# Patient Record
Sex: Female | Born: 1985 | Race: White | Hispanic: No | Marital: Single | Smoking: Never smoker
Health system: Southern US, Community
[De-identification: ages and names within clinical notes are randomized; demographics above are authoritative.]

## PROBLEM LIST (undated history)

## (undated) DIAGNOSIS — F329 Major depressive disorder, single episode, unspecified: Secondary | ICD-10-CM

## (undated) DIAGNOSIS — Z8669 Personal history of other diseases of the nervous system and sense organs: Secondary | ICD-10-CM

## (undated) DIAGNOSIS — F32A Depression, unspecified: Secondary | ICD-10-CM

## (undated) DIAGNOSIS — E669 Obesity, unspecified: Secondary | ICD-10-CM

## (undated) DIAGNOSIS — N926 Irregular menstruation, unspecified: Secondary | ICD-10-CM

## (undated) DIAGNOSIS — E781 Pure hyperglyceridemia: Secondary | ICD-10-CM

## (undated) DIAGNOSIS — T7840XA Allergy, unspecified, initial encounter: Secondary | ICD-10-CM

## (undated) DIAGNOSIS — Z973 Presence of spectacles and contact lenses: Secondary | ICD-10-CM

## (undated) DIAGNOSIS — E282 Polycystic ovarian syndrome: Secondary | ICD-10-CM

## (undated) HISTORY — DX: Depression, unspecified: F32.A

## (undated) HISTORY — DX: Polycystic ovarian syndrome: E28.2

## (undated) HISTORY — DX: Presence of spectacles and contact lenses: Z97.3

## (undated) HISTORY — DX: Allergy, unspecified, initial encounter: T78.40XA

## (undated) HISTORY — DX: Major depressive disorder, single episode, unspecified: F32.9

## (undated) HISTORY — DX: Obesity, unspecified: E66.9

## (undated) HISTORY — DX: Pure hyperglyceridemia: E78.1

## (undated) HISTORY — PX: WISDOM TOOTH EXTRACTION: SHX21

## (undated) HISTORY — DX: Personal history of other diseases of the nervous system and sense organs: Z86.69

## (undated) HISTORY — DX: Irregular menstruation, unspecified: N92.6

---
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
## 2005-12-14 ENCOUNTER — Emergency Department (HOSPITAL_COMMUNITY): Admission: EM | Admit: 2005-12-14 | Discharge: 2005-12-14 | Payer: Self-pay | Admitting: Emergency Medicine

## 2006-02-23 ENCOUNTER — Emergency Department (HOSPITAL_COMMUNITY): Admission: EM | Admit: 2006-02-23 | Discharge: 2006-02-23 | Payer: Self-pay | Admitting: Emergency Medicine

## 2007-07-12 IMAGING — CR DG CHEST 2V
2 series · 2 of 2 positions shown · non-contrast
Comparison: None.

CLINICAL DATA: Fever.

[w chest pa]
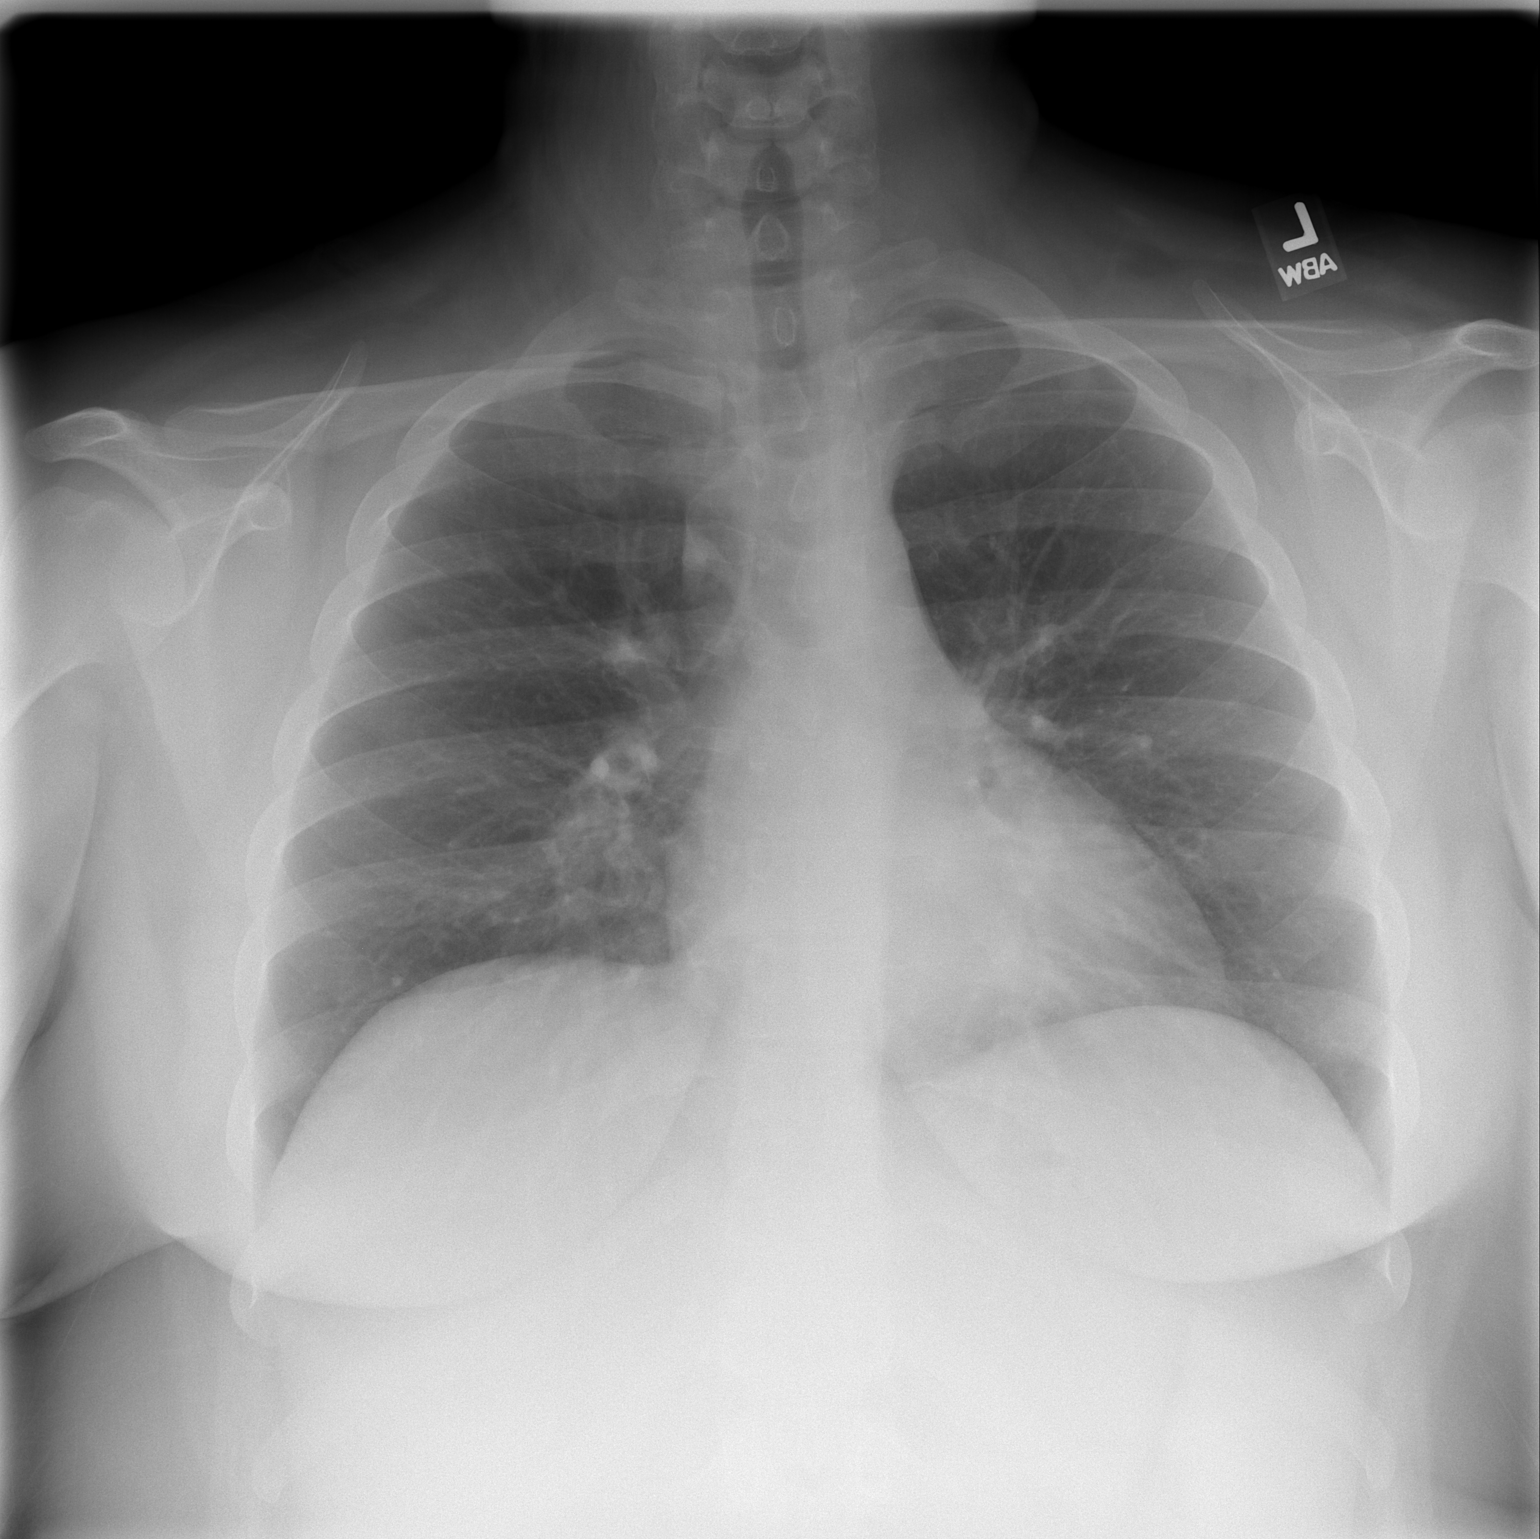

[w chest lat]
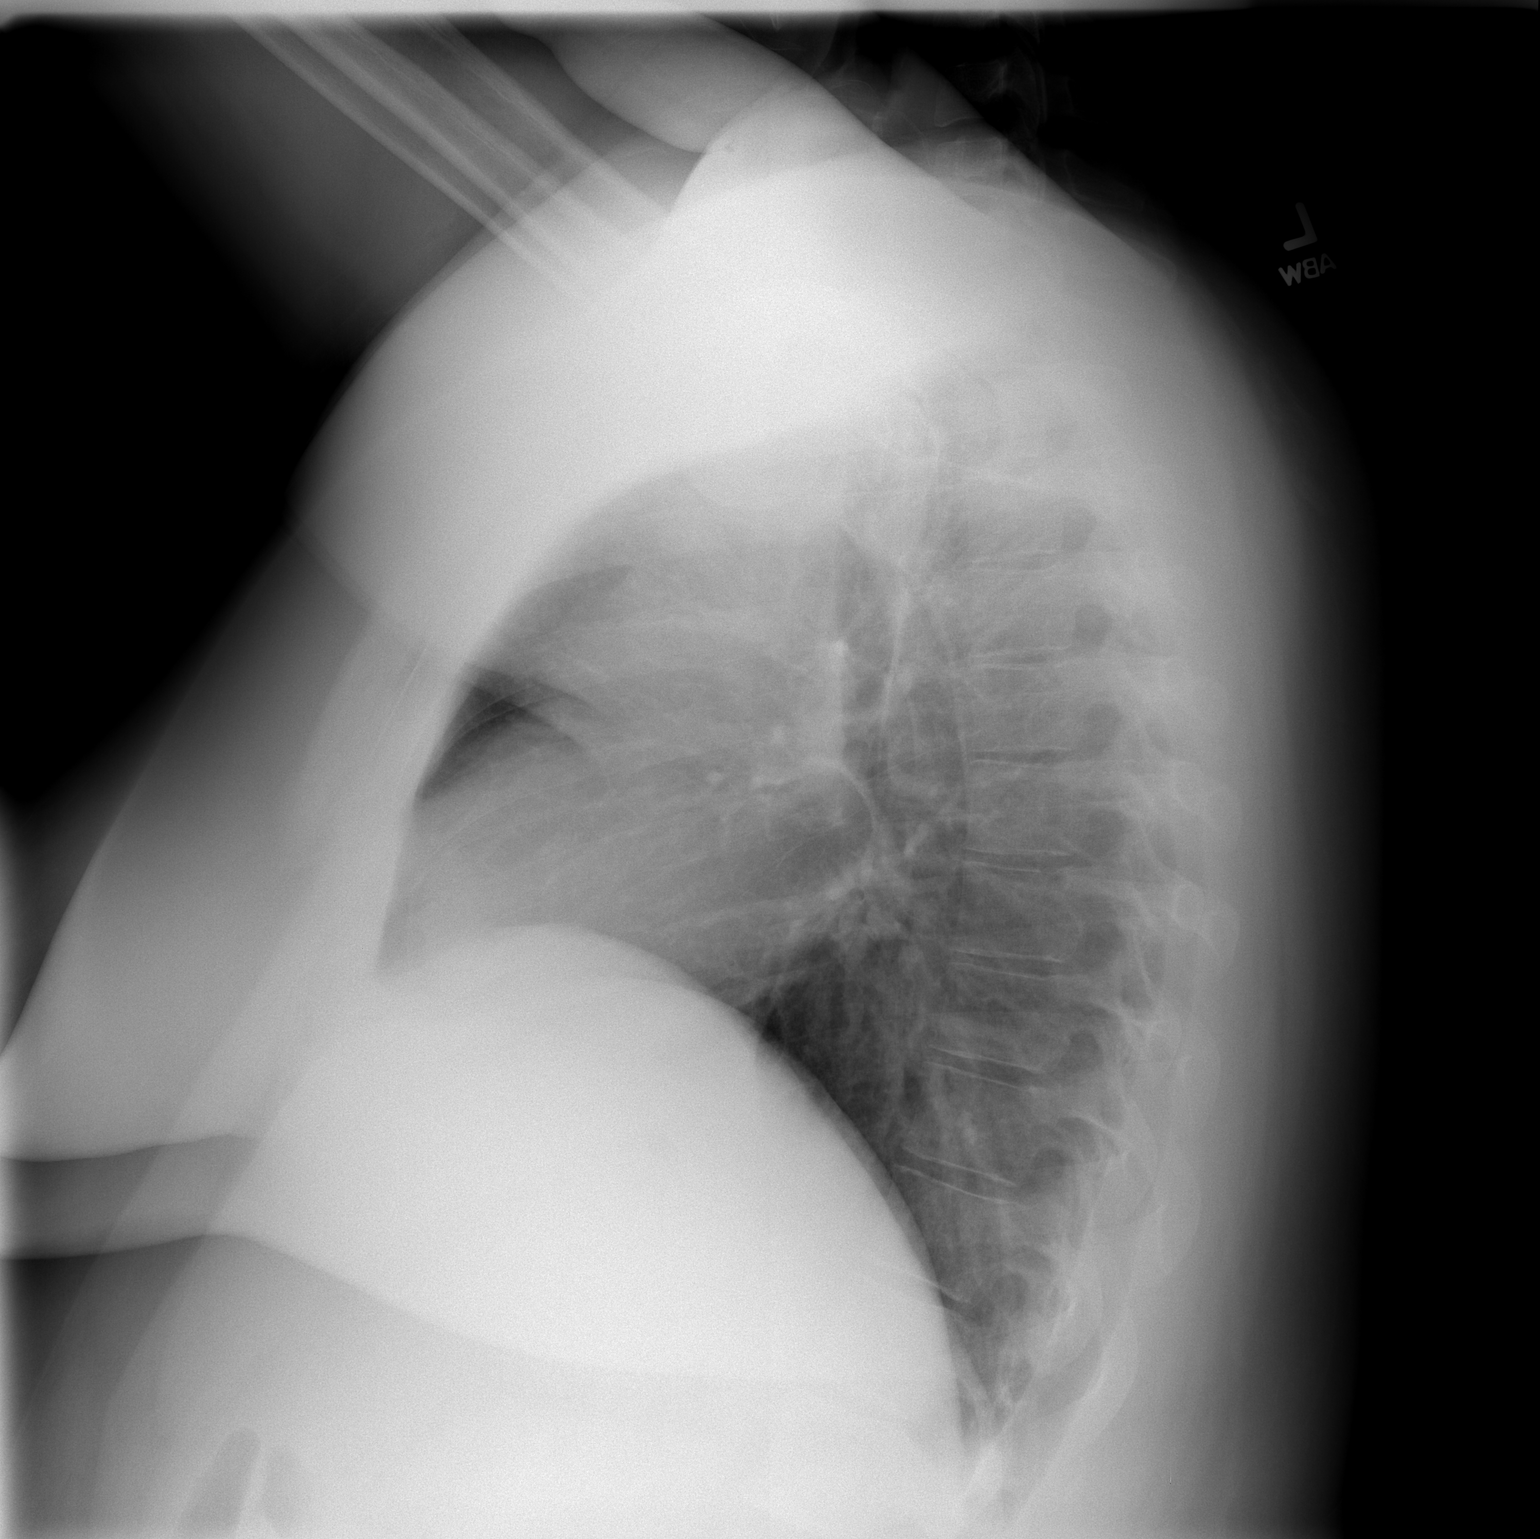

[2 of 2 positions shown; findings below may reference images not displayed]

CHEST - 2 VIEW:

The lungs are clear.  The cardiopericardial silhouette is within normal limits
for size.  Visualized bony structures of the thorax are intact.
IMPRESSION: No acute cardiopulmonary process

## 2010-01-08 ENCOUNTER — Ambulatory Visit: Payer: Self-pay | Admitting: Family Medicine

## 2010-01-08 DIAGNOSIS — M629 Disorder of muscle, unspecified: Secondary | ICD-10-CM | POA: Insufficient documentation

## 2010-01-08 DIAGNOSIS — F341 Dysthymic disorder: Secondary | ICD-10-CM | POA: Insufficient documentation

## 2010-01-08 DIAGNOSIS — N938 Other specified abnormal uterine and vaginal bleeding: Secondary | ICD-10-CM | POA: Insufficient documentation

## 2010-01-08 DIAGNOSIS — N949 Unspecified condition associated with female genital organs and menstrual cycle: Secondary | ICD-10-CM

## 2010-01-12 ENCOUNTER — Ambulatory Visit: Payer: Self-pay | Admitting: Family Medicine

## 2010-01-12 ENCOUNTER — Encounter: Payer: Self-pay | Admitting: Family Medicine

## 2010-01-12 LAB — CONVERTED CEMR LAB
Cholesterol: 186 mg/dL (ref 0–200)
FSH: 5.1 milliintl units/mL
HCT: 43.8 % (ref 36.0–46.0)
HDL: 41 mg/dL (ref >39)
MCV: 95.4 fL (ref 78.0–100.0)
RBC: 4.59 M/uL (ref 3.87–5.11)
TSH: 1.234 microintl units/mL (ref 0.350–4.500)
Total CHOL/HDL Ratio: 4.5
VLDL: 59 mg/dL — ABNORMAL HIGH (ref 0–40)
WBC: 7.9 10*3/uL (ref 4.0–10.5)

## 2010-01-15 ENCOUNTER — Encounter: Payer: Self-pay | Admitting: Family Medicine

## 2010-01-15 LAB — CONVERTED CEMR LAB
BUN: 9 mg/dL (ref 6–23)
CO2: 22 meq/L (ref 19–32)
Glucose, Bld: 82 mg/dL (ref 70–99)
Potassium: 4.3 meq/L (ref 3.5–5.3)

## 2010-01-16 ENCOUNTER — Encounter: Payer: Self-pay | Admitting: Family Medicine

## 2010-01-18 ENCOUNTER — Ambulatory Visit: Payer: Self-pay | Admitting: Sports Medicine

## 2010-01-18 DIAGNOSIS — M65839 Other synovitis and tenosynovitis, unspecified forearm: Secondary | ICD-10-CM | POA: Insufficient documentation

## 2010-01-18 DIAGNOSIS — M65849 Other synovitis and tenosynovitis, unspecified hand: Secondary | ICD-10-CM

## 2010-03-09 ENCOUNTER — Ambulatory Visit: Payer: Self-pay | Admitting: Family Medicine

## 2010-03-09 ENCOUNTER — Telehealth: Payer: Self-pay | Admitting: Family Medicine

## 2010-03-09 DIAGNOSIS — J029 Acute pharyngitis, unspecified: Secondary | ICD-10-CM | POA: Insufficient documentation

## 2010-03-09 LAB — CONVERTED CEMR LAB: Rapid Strep: NEGATIVE

## 2010-06-28 NOTE — Assessment & Plan Note (Signed)
Summary: sore throat x 2 d/[place]/spiegel   Vital Signs:  Patient profile:   25 year old female Height:      64.75 inches Weight:      251.6 pounds BMI:     42.34 Temp:     98.8 degrees F oral Pulse rate:   71 / minute BP sitting:   115 / 78  (left arm) Cuff size:   regular  Vitals Entered By: Garen Grams LPN (March 09, 2010 4:17 PM) CC: sore throat x 2-3 days Is Patient Diabetic? No Pain Assessment Patient in pain? no        Primary Care Provider:  Hulen Luster  CC:  sore throat x 2-3 days.  History of Present Illness: 1. Sore throat: - Been there for 2-3 days - She did have viral URI symptoms about 2 weeks ago and still has a little bit of a cough from that - Otherwise no other symptoms  ROS: denies fevers, swollen lymph nodes, chest pain, shortness of breath, problems eating or drinking  PMHx: She does have a hx of strep throat x 3 about 3 years ago.  SocHx: no new sexual partners  Habits & Providers  Alcohol-Tobacco-Diet     Tobacco Status: current     Tobacco Counseling: to quit use of tobacco products     Cigarette Packs/Day: <0.25  Current Medications (verified): 1)  Ketoprofen Gel 20% .... Use Over Hands Qid  Allergies: No Known Drug Allergies  Social History: Smoking Status:  current Packs/Day:  <0.25  Physical Exam  General:  Vitals reviewed.  Afebrile.  comfortable appearing.  Normal voice.  no acute distress. Eyes:  Normal conjunctiva.  Wears glasses Ears:  R ear normal and L ear normal.   Nose:  no external deformity and no nasal discharge.   Mouth:  Tonsils enlarged bilaterally.  OP red.  No exudate. Neck:  supple, no LAD Lungs:  normal respiratory effort, normal breath sounds, no crackles, and no wheezes.   Heart:  normal rate, regular rhythm, and no murmur.   Abdomen:  soft and non-tender.   Skin:  no rashes and no suspicious lesions.     Impression & Recommendations:  Problem # 1:  SORE THROAT (ICD-462) Assessment New Likely viral  pharyngitis.  Continue supportive care.  Precautions given.  Recheck if not better in 1 week. Orders: Rapid Strep-[facility] (16109) [facility]- Est Level  3 (60454)  Complete Medication List: 1)  Ketoprofen Gel 20%  .... Use over hands qid  Patient Instructions: 1)  Your strep test was negative 2)  Some things that can help with the sore throat include: chloraseptic spray, salt water mouth wash, throat lozenges 3)  It should be better in 1 week 4)  If not better in 1 week then please return to clinic 5)  If the sore throat gets worst or if it is painful to eat / drink then please return to clinic or go to the ED   Orders Added: 1)  Rapid Strep-[facility] [[id number]] 2)  [facility]- Est Level  3 [09811]    Laboratory Results  Date/Time Received: March 09, 2010 4:22 PM  Date/Time Reported: March 09, 2010 4:30 PM   Other Tests  Rapid Strep: negative Comments: ...........test performed by...........Marland KitchenTerese Door, CMA

## 2010-06-28 NOTE — Progress Notes (Signed)
Summary: triage  Phone Note Call from Patient Call back at [phone]   Caller: Patient Summary of Call: has a really bad sore throat and wondering if she can be seen today. Initial call taken by: Clydell Hakim,  March 09, 2010 9:25 AM  Follow-up for Phone Call        sore throat x 2-3. she will start gargling with watm salt water. needs late day appt. will see Dr. Lelon Perla ar 4pm Follow-up by: Golden Circle RN,  March 09, 2010 9:48 AM

## 2010-06-28 NOTE — Assessment & Plan Note (Signed)
Summary: np,df   Vital Signs:  Patient profile:   25 year old female Height:      64.75 inches Weight:      252 pounds BMI:     42.41 BSA:     2.18 Temp:     98.9 degrees F Pulse rate:   103 / minute BP sitting:   118 / 79  Vitals Entered By: Jone Baseman CMA (January 08, 2010 2:46 PM) CC: New patient Is Patient Diabetic? No Pain Assessment Patient in pain? no        Primary Care Provider:  Hulen Luster  CC:  New patient.  History of Present Illness: Pt new to practice today.  Aunt is our pt, Annamaria Helling.    contraception-stopped OCPs in October.  thought her moods were too volatile.  in 6 yr relationship.  using condoms.  having some spotting and irregular menses.  used to be irregular before pills, but was more likely to have fewer periods.  took home u preg 1 week ago that was negative.  moods- feels that she crys occasionally and does not have a lot of motivation.  this is not new, but she thought this would stop when she finished the OCPs.  no SI/HI, does not want to s   ee a counselor   diet- fried foods exercise-walking 2 miles per week.  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    Physical Exam  General:  Well-developed,well-nourished,in no acute distress; alert,appropriate and cooperative throughout  examination obese Head:  Normocephalic and atraumatic without obvious abnormalities. No apparent alopecia or balding. Eyes:  No corneal or conjunctival inflammation noted. EOMI. Perrla. Funduscopic exam benign, without hemorrhages, exudates or papilledema. Vision grossly normal. Ears:  External ear exam shows no significant lesions or deformities.  Otoscopic examination reveals clear canals, tympanic membranes are intact bilaterally without bulging, retraction, inflammation or discharge. Hearing is grossly normal bilaterally. Nose:  External nasal examination shows no deformity or inflammation. Nasal mucosa are pink and moist without lesions or exudates. Mouth:  Oral mucosa and oropharynx without lesions or exudates.  Teeth in good repair. Neck:  No deformities, masses, or tenderness noted. Lungs:  Normal respiratory effort, chest expands symmetrically. Lungs are clear to auscultation, no crackles or wheezes. Heart:  Normal rate and regular rhythm. S1 and S2 normal without gallop, murmur, click, rub or other extra sounds. Abdomen:  Bowel sounds positive,abdomen soft and non-tender without masses, organomegaly or hernias noted. Genitalia:  deferred, pt on menses Msk:  bilateral hands with cysts in between the knuckles.  the largest is in left hand, soft, quarter sized.  another 1/2 cm sized same hand, and dime sized one on right hand.   Pulses:  R and L carotid,radial,femoral,dorsalis pedis and posterior tibial pulses are full and equal bilaterally Extremities:  No clubbing, cyanosis, edema, or deformity noted with normal  full range of motion of all joints.   Neurologic:  No cranial nerve deficits noted. Station and gait are normal.Sensory, motor and coordinative functions appear intact. Skin:  Intact without suspicious lesions or rashes Cervical Nodes:  No lymphadenopathy noted Psych:  Cognition and judgment appear intact. Alert and cooperative with normal attention span and concentration. No  apparent delusions, illusions, hallucinations   Impression & Recommendations:  Problem # 1:  HEALTH MAINTENANCE EXAM (ICD-V70.0) Assessment New new pt physical today.  deferred pelvic, pt will return when not on menses.  due for PAP. check Lipids and A1c given obesity and posibility of PCOS.  advised to improve diet and increase exercise.  Problem # 2:  OTHER DISORDER OF MUSCLE LIGAMENT AND FASCIA (ICD-728.89) Assessment: New precepted with Dr. Mauricio Po who advised a sports medicine evaluation.  U/S may help determine what this is.  Does not seem to hinder movt, is not painful, not red or infected.  Problem # 3:  DYSFUNCTIONAL UTERINE BLEEDING (ICD-626.8) Assessment: New consider diagnosis of PCOS given body habitus, irregular periods. will get labs to evaluate.  Future Orders: CBC-[facility] (16109) ... 01/04/2011 FSH-[facility] ([id number]) ... 01/03/2011 Prolactin-[facility] ([id number]) ... 01/03/2011 TSH-[facility] ([id number]) ... 01/09/2011 Testosterone-[facility] ([id number]) ... 01/03/2011  Problem # 4:  DYSTHYMIA, SITUATIONAL (ICD-300.4) Assessment: New PHQ9 done today.  9 points.  question 9 is not at all.  also this is only somewhat difficult.  no SI/HI.  will watch for now.    Other Orders: Future Orders: Lipid-[facility] ([id number]) ... 01/03/2011 A1C-[facility] ([id number]) ... 12/28/2010  Patient Instructions: 1)  make an appt with sports medicine to follow up for your hand 2)  when you have finished bleeding, call to make appt for your pap.  3)  Also, make a lab appt to come in fasting for some lab work.  We can check thyroid, cholesterol, sugar, and your hormone levels.

## 2010-06-28 NOTE — Assessment & Plan Note (Signed)
Summary: ultrasound of hand/kh   Vital Signs:  Patient profile:   25 year old female BP sitting:   129 / 81  Vitals Entered By: Lillia Pauls CMA (January 18, 2010 9:12 AM)  Primary Provider:  Hulen Luster   History of Present Illness: 25yo R-handed female to office with c/o b/l hand swelling. Recently evaluated at [facility] 01/08/10 & noted to have swollen areas or dorsal aspect of hands between knuckles. Left hand swollen on dorsal aspect between 2nd & 3rd finger and between 3rd & 4th finger x 67-months. Had pain in area when started, but now only having occasional pain.   Right hand starting to swell on dorsal aspect between 2nd & 3rd fingers over last month.  Only having occasional pain in this hands. Occasional numbness/tingling of hands, but in no specific distribution. No injury/trauma. Using tylenol as needed. Works at [employer] & does a lot of typing.  Also works as a Midwife. 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 MS:  See HPI. Derm:  Denies rash.  Physical Exam  General:  well-hydrated, appropriate dress, cooperative to examination, and overweight-appearing.  well-hydrated, appropriate dress, and cooperative to examination.   Msk:  HANDS: Inspection:  areas of edema on  the dorsal aspect of the hands bilaterally noted between second and third metacarpals & third and fourth metacarpals (L>R) Palpation: no tenderness to palpation over the metacarpals or phylanges.  No tenderness over soft tissues. ROM: normal wrist and MCP flexion and extension, finger abduction and adduction, and movement at PIP and DIP bilaterally. Strength: 5/5 wrist flexion and extension, thumb extension, flexion and adduction, PIP and DIP flexion, and independent finger extension, bilaterally Pulses:  normal cap refill, radial pulses +2/4 b/l Neurologic:  sensation intact to light touch.   Additional Exam:  MSK U/S - bilateral hands: Significant fluid noted around the extensor digitorum tendon sheaths of the second and third fingers, bilaterally (R>L).  Normal appearing tendon sheath for 4th & 5th fingers .Images saved.   Impression & Recommendations:  Problem # 1:  OTHER TENOSYNOVITIS OF HAND AND WRIST (ICD-727.05)  - Tenosynovitis of bilateral finger extensors in area between 2nd & 3rd metacarpals and between 3rd & 4th metacarpals - L>R - Rx for ketaprofen gel given to apply to area 3-4 times daily - Should lower keyboard at work to decrease stress on area - Buddy tape 2nd & 3rd fingers of both hands when able to limit stress on tendons. - f/u 6-weeks for re-evaluation & f/u ultrasound.  If symptoms persist could consider hand therapy or brace. - encouraged to call with any questions or concerns.  Orders: [place] LIMITED ([id number])  Complete Medication List: 1)  Ketoprofen Gel 20%  .... Use over hands qid  Patient Instructions: 1)  You have some swelling left in the space around the tendons in your fingers. 2)  Lower your keyboard at work so that you don't have to extend your wrist back as much. 3)  Tape your pointer and middle fingers together for the next couple weeks so that the muscles work together. 4)  Use the topical ketoprofen medication on the swollen areas and use ice to bring  down the swelling. 5)  Please come back in 6 weeks so we can look to see if the swelling has gone down. If it doesn't get better, we can think about hand therapy and a brace at that point. Prescriptions: KETOPROFEN GEL 20% Use over hands qid  #60 gms x 6   Entered by:   Lillia Pauls CMA   Authorized by:   Enid Baas MD   Signed by:   Lillia Pauls CMA on 01/18/2010   Method used:   Print then Give to Patient   RxID:   [id number]

## 2010-06-28 NOTE — Letter (Signed)
Summary: Generic Letter  [facility]  [address]   [place], [place] [address]   Phone: [phone]  Fax: [phone]    01/16/2010  Victoria Wilson [address] [place], [place]  [address]  Dear Ms. Gossman,  I wanted to let you know that overall your labs look good.  no evidence of diabetes at this time.  Your cholesterol was good except for triglycerides which were elevated.  You should try to limit fried foods and sweets as much as possible.       TLC Diet (Therapeutic Lifestyle Change): Saturated Fats & Transfatty acids should be kept < 7% of total calories ***Reduce Saturated Fats Polyunstaurated Fat can be up to 10% of total calories Monounsaturated Fat Fat can be up to 20% of total calories Total Fat should be no greater than 25-35% of total calories Carbohydrates should be 50-60% of total calories Protein should be approximately 15% of total calories Fiber should be at least 20-30 grams a day ***Increased fiber may help lower LDL Total Cholesterol should be < 200mg /day Consider adding plant stanol/sterols to diet (example: Benacol spread) ***A higher intake of unsaturated fat may reduce Triglycerides and Increase HDL    Adjunctive Measures (may lower LIPIDS and reduce risk of Heart Attack) include: Aerobic Exercise (20-30 minutes 3-4 times a week) Limit Alcohol Consumption Weight Reduction Aspirin 75-81 mg a day by mouth (if not allergic or contraindicated) Dietary Fiber 20-30 grams a day by mouth     Current Medications:  None If you have any questions, please call. We appreciate being able to work with you.   Sincerely,    [facility] Ellery Plunk MD   Appended Document: Generic Letter mailed

## 2010-10-16 ENCOUNTER — Emergency Department (HOSPITAL_COMMUNITY)
Admission: EM | Admit: 2010-10-16 | Discharge: 2010-10-16 | Discharge status: Against Medical Advice | Payer: Self-pay | Attending: Emergency Medicine | Admitting: Emergency Medicine

## 2010-10-16 DIAGNOSIS — W268XXA Contact with other sharp object(s), not elsewhere classified, initial encounter: Secondary | ICD-10-CM | POA: Insufficient documentation

## 2010-10-16 DIAGNOSIS — S61409A Unspecified open wound of unspecified hand, initial encounter: Secondary | ICD-10-CM | POA: Insufficient documentation

## 2010-12-19 ENCOUNTER — Ambulatory Visit (INDEPENDENT_AMBULATORY_CARE_PROVIDER_SITE_OTHER): Payer: BC Managed Care – PPO | Admitting: Medical

## 2010-12-19 ENCOUNTER — Encounter: Payer: Self-pay | Admitting: Medical

## 2010-12-19 VITALS — BP 120/84 | HR 68 | Temp 99.0°F | Ht 63.0 in | Wt 250.0 lb

## 2010-12-19 DIAGNOSIS — F329 Major depressive disorder, single episode, unspecified: Secondary | ICD-10-CM

## 2010-12-19 DIAGNOSIS — F32A Depression, unspecified: Secondary | ICD-10-CM

## 2010-12-19 DIAGNOSIS — J029 Acute pharyngitis, unspecified: Secondary | ICD-10-CM

## 2010-12-19 DIAGNOSIS — G479 Sleep disorder, unspecified: Secondary | ICD-10-CM

## 2010-12-19 DIAGNOSIS — E669 Obesity, unspecified: Secondary | ICD-10-CM

## 2010-12-19 MED ORDER — CITALOPRAM HYDROBROMIDE 20 MG PO TABS: ORAL_TABLET | ORAL | Status: DC

## 2010-12-19 NOTE — Progress Notes (Signed)
Subjective:     Victoria Wilson is a 25 y.o. female who presents as a new patient for concern of sore throat and tongue swelling.  She report 1 wk hx/o back of tongue feeling swollen and with bumps, tonsils are red and swollen.  Boyfriend was sick with a cold last week.  She notes similar symptoms back in March with normal throat culture.  Using Dayquil and Nyquil, also using salt water gargles.   No other aggravating or relieving factors.  She has not started any recent medications, no other swelling.  No rash.   She notes other ongoing issues in which she has not seen a doctor for.   She did not have insurance prior so she did not seek medical care for a while now.   She is past due on Pap smear.  She notes ongoing issues with decreased mood, stressors, worries a lot, has crying spells all time, sometimes about little things. She does note being depressed off and on for the past year.  Her depressed mood is worse around her periods and they're crampy and painful. She has trouble sleeping, and gets about one bad migraine per month.  He works as a TEFL teacher. She has never been on medication for depressed mood   The following portions of the patient's history were reviewed and updated as appropriate: allergies, current medications, past family history, past medical history, past social history, past surgical history and problem list.  Review of Systems Constitutional: denies fever, chills, sweats, unexpected weight change, anorexia, fatigue Allergy: denies recent sneezing, itching, congestion Dermatology: denies rash ENT: no runny nose, ear pain, sinus pain, teeth pain Cardiology: denies chest pain, palpitations Respiratory: +mild cough; denies shortness of breath, wheezing,  Gastroenterology: denies abdominal pain, nausea, vomiting, diarrhea Musculoskeletal: denies arthralgias, myalgias  Objective:     Filed Vitals:   12/19/10 1109  BP: 120/84  Pulse: 68  Temp: 99 F (37.2 C)     General appearance: no distress, WD/WN, pleasant, white female Skin: no rash, no warmth HEENT: normocephalic, conjunctiva/corneas normal, sclerae anicteric, nares patent, no discharge or erythema, pharynx with erythema, no exudate.  Oral cavity: MMM, no lesions  Neck: supple, no lymphadenopathy, no thyromegaly Heart: RRR, normal S1, S2, no murmurs Lungs: CTA bilaterally, no wheezes, rhonchi, or rales  Laboratory Strep test done. Results:negative.    Assessment:   Encounter Diagnoses  Name Primary?  . Pharyngitis Yes  . Depression   . Obesity   . Sleep disturbance       Plan:   Pharyngitis-Advised that symptoms and exam suggest a viral etiology.  Discussed symptomatic treatment including salt water gargles, warm fluids, rest, hydrate well, can use over-the-counter Tylenol or ibuprofen for throat pain, fever, or malaise. If worse or not improving within 2-3 days, call or return.   Depression-we discussed her concerns and stressors which include financial, work related stress, and relationship stress. I advise she began exercising more frequently. She agrees to a trial of citalopram. Recheck on this in 2-3 weeks. Discussed risk/benefits of medication.  Obesity-discussed the need to work on diet, exercise and weight loss changes.  Sleep disturbance-discussed sleep hygiene, recheck in 2-3 weeks.  Return 2-3 weeks for recheck on citalopram. She also needs to return for a complete physical.

## 2011-01-21 ENCOUNTER — Telehealth: Payer: Self-pay | Admitting: *Deleted

## 2011-01-21 NOTE — Telephone Encounter (Signed)
If she is taking 1 tablet QHS, then take 1/2 tablet QHS for 2-3 days, then stop this medication.  Lets have her come back in for followup and discussion of other options regarding her mood/symptoms.

## 2011-01-21 NOTE — Telephone Encounter (Signed)
Pt informed to take Celexa 1/2 tablet QHS for the next 2-3 days and then to stop taking celexa.  Pt scheduled to come in on Friday at 8:45am to discuss other medications for depression.  CM, LPN

## 2011-01-29 ENCOUNTER — Institutional Professional Consult (permissible substitution): Payer: BC Managed Care – PPO | Admitting: Medical

## 2011-02-15 ENCOUNTER — Ambulatory Visit (INDEPENDENT_AMBULATORY_CARE_PROVIDER_SITE_OTHER): Payer: BC Managed Care – PPO | Admitting: Medical

## 2011-02-15 ENCOUNTER — Encounter: Payer: Self-pay | Admitting: Medical

## 2011-02-15 VITALS — BP 116/72 | HR 100 | Temp 98.5°F | Resp 20 | Ht 63.5 in | Wt 254.0 lb

## 2011-02-15 DIAGNOSIS — R51 Headache: Secondary | ICD-10-CM

## 2011-02-15 DIAGNOSIS — F329 Major depressive disorder, single episode, unspecified: Secondary | ICD-10-CM

## 2011-02-15 DIAGNOSIS — F3289 Other specified depressive episodes: Secondary | ICD-10-CM

## 2011-02-15 DIAGNOSIS — R519 Headache, unspecified: Secondary | ICD-10-CM

## 2011-02-15 DIAGNOSIS — F32A Depression, unspecified: Secondary | ICD-10-CM

## 2011-02-15 MED ORDER — SERTRALINE HCL 25 MG PO TABS: 25.0000 mg | ORAL_TABLET | Freq: Every day | ORAL | Status: DC

## 2011-02-15 NOTE — Patient Instructions (Addendum)
Use ice pack or cool compress to the back of the left side of your head twice daily, and use OTC Aleve twice daily for the next 1-2 weeks.  If not resolving, improving, or if getting worse, call or return.   Things to watch for:  If red, hot, more tenderness, this may suggest infection.   If bony, hard growth getting bigger, then certainly return.  If you notice a mobile, round mass, this may suggest a cyst.   Thus, if you notice any of these changes, let me know.    Regarding your citalopram  - take 1/2 tablet daily until you run out.  Simultaneously, begin 1/2 tablet of the Zoloft daily.  Once you run out of Citalopram, then begin 1 tablet of Zoloft daily.

## 2011-02-15 NOTE — Progress Notes (Signed)
  Subjective:   HPI  Victoria Wilson is a 25 y.o. female who presents for recheck.  Here for c/o knot in the back of head.  First noticed it yesterday.  Not sure if it was there prior or not.  It hurts to press on it.  Denies heat, redness.  Does get ringing in ear at times, vision seems different at times.  At times looking down, vision seems off.  Last eye doctor visit 1 year ago.  Otherwise has been in her usual state of health.  No other aggravating or relieving factors.    She is here for recheck on depression.  I started her on citalopram at last visit, and she does notice improvement in her mood, less anxiety and agitation, certainly less crying spells.  However, since being on the medication, her sleep has been aggravated, she notices increased sweating, her appetite is off compared to normal, and she has been getting weird dreams. She would like to try a different medication instead.  The following portions of the patient's history were reviewed and updated as appropriate: allergies, current medications, past family history, past medical history, past social history, past surgical history and problem list.  Past Medical History  Diagnosis Date  . Allergy   . Obesity   . Hypertriglyceridemia   . Wears glasses   . Irregular periods   . History of migraine headaches     Review of Systems Constitutional: denies fever, chills, sweats, unexpected weight change, fatigue Dermatology: denies rash ENT: no runny nose, ear pain, sore throat, hoarseness, sinus pain Cardiology: denies chest pain, palpitations, edema Respiratory: denies cough, shortness of breath, wheezing Gastroenterology: denies abdominal pain, nausea, vomiting, diarrhea, constipation Musculoskeletal: denies arthralgias, myalgias, no neck pain Urology: denies dysuria Neurology: no weakness, tingling, numbness     Objective:   Physical Exam  General appearance: alert, no distress, WD/WN, white female HEENT:  Left  posterolateral occiput with slight hard raised area, tender, only slightly asymmetrical compared to right occiput.  No obvious warmth, erythema, fluctuance, no obvious bony mass, no obvious cyst.  Otherwise normocephalic, sclerae anicteric, PERRLA, EOMi, TMs pearly, nares patent, no discharge or erythema, pharynx normal Oral cavity: MMM, no lesions Neck: supple, no lymphadenopathy, no thyromegaly, no masses, nontender Heart: RRR, normal S1, S2, no murmurs Lungs: CTA bilaterally, no wheezes, rhonchi, or rales Neurological: alert, oriented x 3, CN2-12 intact   Assessment :    Encounter Diagnoses  Name Primary?  . Occipital pain Yes  . Depression       Plan:  Occipital pain - advised that the exam finding is subtle and hard to say at this point whether or not this represents a bony abnormality versus soft tissue abnormality.  We will use a watch and wait approach. For now she'll use ice or cool compress as well as ibuprofen assuming this may be inflammation or lymphadenitis.  We discussed signs of infection versus signs of bony tissue growth, and she will call or return if this is not resolving within the next 2-3 weeks. Or she can call otherwise when necessary if things change.  Depression - although she had improvement in terms of crying spells and depressed mood, she also has had increased and sleep problems, sweats, unusual dreams, and does not wish to continue on citalopram. We will wean her off citalopram, and we will start her on Zoloft as an alternative. She will recheck in one month, sooner if needed.  Handout with instructions given.

## 2011-02-26 ENCOUNTER — Encounter: Payer: Self-pay | Admitting: Medical

## 2011-02-26 ENCOUNTER — Ambulatory Visit (INDEPENDENT_AMBULATORY_CARE_PROVIDER_SITE_OTHER): Payer: BC Managed Care – PPO | Admitting: Medical

## 2011-02-26 VITALS — BP 128/70 | HR 68 | Temp 98.1°F | Resp 20 | Ht 65.0 in | Wt 257.0 lb

## 2011-02-26 DIAGNOSIS — L0212 Furuncle of neck: Secondary | ICD-10-CM

## 2011-02-26 MED ORDER — DOXYCYCLINE HYCLATE 100 MG PO TABS: 100.0000 mg | ORAL_TABLET | Freq: Two times a day (BID) | ORAL | Status: AC

## 2011-02-26 NOTE — Progress Notes (Signed)
Subjective:   HPI Victoria Wilson is a 25 y.o. female who presents for sore bump that is red and swollen on back of neck.  She saw me on 02/15/11 for swollen lymph nodes behind left ear, but over the last week or so she has developed a red bump that drained some pus and blood a few days ago.  The whole back of neck is tender and she notes some new painful lymph nodes swollen.  No other aggravating or relieving factors.  No other c/o.  The following portions of the patient's history were reviewed and updated as appropriate: allergies, current medications, past family history, past medical history, past social history, past surgical history and problem list.  Past Medical History  Diagnosis Date  . Allergy   . Obesity   . Hypertriglyceridemia   . Wears glasses   . Irregular periods   . History of migraine headaches    Review of Systems Gen: no fever, chills, sweats GI: no pain, nvd HEENT: no sore throat,t congestion, cough, ear pain Neck: posterior tenderness     Objective:   Physical Exam  General appearance: alert, no distress, WD/WN Skin: posterior neck with 2 erythematous tender papules, several small tender lymph nodes, but otherwise no drainage, no induration, no fluctuance Neck: nontender otherwise, no masses, no thyromegaly   Assessment :    Encounter Diagnosis  Name Primary?  Hester Mates of neck Yes     Plan:    Antibiotic: Doxycycline, advised warm compresses, rest, keep area clean with soap and water.  RTC if worse or not improving.

## 2011-02-26 NOTE — Patient Instructions (Signed)
Begin Doxycycline antibiotic, use warm compresses, and use OTC Ibuprofen for pain and inflammation.  If not improving or worse let me know.

## 2011-03-19 ENCOUNTER — Telehealth: Payer: Self-pay | Admitting: Medical

## 2011-03-20 ENCOUNTER — Institutional Professional Consult (permissible substitution): Payer: BC Managed Care – PPO | Admitting: Medical

## 2011-03-20 ENCOUNTER — Ambulatory Visit: Payer: BC Managed Care – PPO | Admitting: Medical

## 2011-03-20 NOTE — Telephone Encounter (Signed)
She is due back now for recheck on medication.

## 2011-03-21 NOTE — Telephone Encounter (Signed)
Pt was called and an appointment was made 

## 2011-04-02 ENCOUNTER — Institutional Professional Consult (permissible substitution): Payer: BC Managed Care – PPO | Admitting: Medical

## 2011-04-04 ENCOUNTER — Ambulatory Visit (INDEPENDENT_AMBULATORY_CARE_PROVIDER_SITE_OTHER): Payer: BC Managed Care – PPO | Admitting: Medical

## 2011-04-04 ENCOUNTER — Encounter: Payer: Self-pay | Admitting: Medical

## 2011-04-04 VITALS — BP 122/70 | HR 120 | Temp 98.4°F | Wt 260.0 lb

## 2011-04-04 DIAGNOSIS — F329 Major depressive disorder, single episode, unspecified: Secondary | ICD-10-CM

## 2011-04-04 DIAGNOSIS — F32A Depression, unspecified: Secondary | ICD-10-CM | POA: Insufficient documentation

## 2011-04-04 DIAGNOSIS — R000 Tachycardia, unspecified: Secondary | ICD-10-CM | POA: Insufficient documentation

## 2011-04-04 DIAGNOSIS — E669 Obesity, unspecified: Secondary | ICD-10-CM | POA: Insufficient documentation

## 2011-04-04 MED ORDER — SERTRALINE HCL 50 MG PO TABS: 50.0000 mg | ORAL_TABLET | Freq: Every day | ORAL | Status: DC

## 2011-04-04 NOTE — Progress Notes (Signed)
Subjective:   HPI  Victoria Wilson is a 25 y.o. female who presents for recheck on depression and medication.  Overall she feels as though Zoloft 25 mg has made an improvement in her mood and certainly improved her crying spells.  She had bad nightmares with Citalopram, but has tolerated Zoloft very well. She thinks it could be a little stronger though.  She has had some new stressors recently including she moved to a different house, took in a new pet that is 9yo but not completely house broken, and still has stress with work.  She works as a TEFL teacher.  She is in a relationship and this is going well.  She notes that several people on her father's side have depression and her father drinks a lot of alcohol.  No other aggravating or relieving factors.    No other c/o.  The following portions of the patient's history were reviewed and updated as appropriate: allergies, current medications, past family history, past medical history, past social history, past surgical history and problem list.  Past Medical History  Diagnosis Date  . Allergy   . Obesity   . Hypertriglyceridemia   . Wears glasses   . Irregular periods   . History of migraine headaches   . Depression    Review of Systems Constitutional: -fever, -chills, -sweats, -unexpected -weight change,-fatigue ENT: -runny nose, -ear pain, -sore throat Cardiology:  -chest pain, -palpitations, -edema Respiratory: -cough, -shortness of breath, -wheezing Gastroenterology: -abdominal pain, -nausea, -vomiting, -diarrhea, -constipation Hematology: -bleeding or bruising problems Musculoskeletal: -arthralgias, -myalgias, -joint swelling, -back pain Ophthalmology: -vision changes Urology: -dysuria, -difficulty urinating, -hematuria, -urinary frequency, -urgency Neurology: -headache, -weakness, -tingling, -numbness    Objective:   Physical Exam  Filed Vitals:   04/04/11 1515  BP: 122/70  Pulse: 120  Temp: 98.4 F (36.9 C)     General appearance: alert, no distress, WD/WN, white female  Neck: supple, no lymphadenopathy, no thyromegaly, no masses Heart: RRR, normal S1, S2, no murmurs Lungs: CTA bilaterally, no wheezes, rhonchi, or rales Pulses: 2+ symmetric, upper and lower extremities, normal cap refill   Assessment and Plan :    Encounter Diagnoses  Name Primary?  . Depression Yes  . Obesity   . Tachycardia    Depression - we will increase Zoloft to 50mg  QHS.  She will call in 2 wk to let me know how this is doing, sooner prn.  Obesity - discussed diet, exercise, and weight loss strategies and consequences of ongoing obesity  Tachycardia - likely related to her weight.  Advised lifestyle changes.  Follow-up 3-4 mo for complete physical

## 2011-04-30 ENCOUNTER — Other Ambulatory Visit: Payer: Self-pay | Admitting: Medical

## 2011-04-30 ENCOUNTER — Telehealth: Payer: Self-pay | Admitting: Medical

## 2011-04-30 MED ORDER — SERTRALINE HCL 50 MG PO TABS: 50.0000 mg | ORAL_TABLET | Freq: Every day | ORAL | Status: DC

## 2011-04-30 NOTE — Telephone Encounter (Signed)
rx sent for 90 day 

## 2011-05-01 NOTE — Telephone Encounter (Signed)
CALLED PT. ADVISED THAT 90 DY SCRIPT CALLED IN.

## 2012-05-14 ENCOUNTER — Other Ambulatory Visit: Payer: Self-pay | Admitting: Medical

## 2012-05-14 NOTE — Telephone Encounter (Signed)
Pt called and stated that she needed a refill on zoloft. When I looked at history I asked if she was taking regularly. She stated that she was trying not to take it anymore but she needed to be back on the medication. [facility] uses [facility].

## 2012-05-15 ENCOUNTER — Other Ambulatory Visit: Payer: Self-pay | Admitting: Medical

## 2012-05-15 MED ORDER — SERTRALINE HCL 50 MG PO TABS: 50.0000 mg | ORAL_TABLET | Freq: Every day | ORAL | Status: DC

## 2012-05-15 NOTE — Telephone Encounter (Signed)
L/m and informed pt rx is at pharmacy and she needs and appointment per shane

## 2012-05-15 NOTE — Telephone Encounter (Signed)
i sent a month supply, and lets see her back to discuss medications and concerns

## 2012-11-23 ENCOUNTER — Ambulatory Visit (INDEPENDENT_AMBULATORY_CARE_PROVIDER_SITE_OTHER): Payer: BC Managed Care – PPO | Admitting: Medical

## 2012-11-23 ENCOUNTER — Encounter: Payer: Self-pay | Admitting: Medical

## 2012-11-23 VITALS — BP 122/70 | HR 88 | Temp 98.8°F | Resp 16 | Wt 257.0 lb

## 2012-11-23 DIAGNOSIS — R140 Abdominal distension (gaseous): Secondary | ICD-10-CM

## 2012-11-23 DIAGNOSIS — K921 Melena: Secondary | ICD-10-CM

## 2012-11-23 DIAGNOSIS — F3289 Other specified depressive episodes: Secondary | ICD-10-CM

## 2012-11-23 DIAGNOSIS — R141 Gas pain: Secondary | ICD-10-CM

## 2012-11-23 DIAGNOSIS — F329 Major depressive disorder, single episode, unspecified: Secondary | ICD-10-CM

## 2012-11-23 DIAGNOSIS — F32A Depression, unspecified: Secondary | ICD-10-CM

## 2012-11-23 DIAGNOSIS — R195 Other fecal abnormalities: Secondary | ICD-10-CM

## 2012-11-23 MED ORDER — HYDROCORTISONE 2.5 % RE CREA: TOPICAL_CREAM | Freq: Two times a day (BID) | RECTAL | Status: DC

## 2012-11-23 MED ORDER — SERTRALINE HCL 50 MG PO TABS: 50.0000 mg | ORAL_TABLET | Freq: Every day | ORAL | Status: DC

## 2012-11-23 NOTE — Progress Notes (Signed)
Subjective: Here for blood in stool.  BMs in general have been fine up until this past week.  Last few nights been having inconsistent stools, chunks of stool instead of fully formed or loose stools.  Has had some urge to defecate, abdominal bloating, some feeling of not completely emptying bowels, having more straining and effort to pass stool the last few days.  Denies hard painful bowel movements, no mucous, no fever, no diarrhea, no nausea or vomiting. Thinks this is all due to recent stress.  When under more stress, tends to get bloating, cramping, and change in stool.  No prior hx/o IBS.   What is new is today has had a few episodes of BRBPR.  Hasn't had this prior.  Occurred on toilet paper 3 times, each time getting lighter.  Denies family hx/o colon cancer.   Denies prior hemorrhoids, fissure.    Needs refill on sertraline.   Still seems to do well on this.  Has had a little more stress given death of her grandparents 4 months apart since Christmas.    Past Medical History  Diagnosis Date  . Allergy   . Obesity   . Hypertriglyceridemia   . Wears glasses   . Irregular periods   . History of migraine headaches   . Depression    ROS as in subjective  Objective: Filed Vitals:   11/23/12 1427  BP: 122/70  Pulse: 88  Temp: 98.8 F (37.1 C)  Resp: 16    General appearance: alert, no distress, WD/WN,  Abdomen: +bs, soft, non tender, non distended, no masses, no hepatomegaly, no splenomegaly Rectal: small external hemorrhoid, no fissure, nontender, normal rectal tone, small internal hemorrhoid palpated, occult + stool, exam chaperoned by nurse   Assessment: Encounter Diagnoses  Name Primary?  . Abdominal bloating Yes  . Blood in stool   . Change in stool   . Depression      Plan: Possible early IBS symptoms, internal hemorrhoid.   discussed symptoms, concerns, avoidance of trigger foods, reduce stressors where possible.  Begin short term Anusol suppository, discussed fiber  in diet, water intake, SITZ baths.  F/u if this continues to be a problem or happens more frequently.  Depression - doing well on current medication, refills today.  Follow-up with call back 2wk.

## 2015-01-24 ENCOUNTER — Other Ambulatory Visit (HOSPITAL_COMMUNITY)
Admission: RE | Admit: 2015-01-24 | Discharge: 2015-01-24 | Disposition: A | Discharge status: Home / Self Care | Payer: BLUE CROSS/BLUE SHIELD | Source: Ambulatory Visit | Attending: Medical | Admitting: Medical

## 2015-01-24 ENCOUNTER — Encounter: Payer: Self-pay | Admitting: Medical

## 2015-01-24 ENCOUNTER — Ambulatory Visit (INDEPENDENT_AMBULATORY_CARE_PROVIDER_SITE_OTHER): Payer: BLUE CROSS/BLUE SHIELD | Admitting: Medical

## 2015-01-24 VITALS — BP 122/80 | HR 74 | Temp 98.4°F | Resp 18 | Ht 65.0 in | Wt 272.0 lb

## 2015-01-24 DIAGNOSIS — Z0000 Encounter for general adult medical examination without abnormal findings: Secondary | ICD-10-CM | POA: Diagnosis not present

## 2015-01-24 DIAGNOSIS — Z124 Encounter for screening for malignant neoplasm of cervix: Secondary | ICD-10-CM

## 2015-01-24 DIAGNOSIS — F419 Anxiety disorder, unspecified: Secondary | ICD-10-CM | POA: Insufficient documentation

## 2015-01-24 DIAGNOSIS — Z01419 Encounter for gynecological examination (general) (routine) without abnormal findings: Secondary | ICD-10-CM | POA: Insufficient documentation

## 2015-01-24 DIAGNOSIS — E282 Polycystic ovarian syndrome: Secondary | ICD-10-CM | POA: Insufficient documentation

## 2015-01-24 DIAGNOSIS — Z113 Encounter for screening for infections with a predominantly sexual mode of transmission: Secondary | ICD-10-CM | POA: Insufficient documentation

## 2015-01-24 DIAGNOSIS — R4681 Obsessive-compulsive behavior: Secondary | ICD-10-CM | POA: Insufficient documentation

## 2015-01-24 DIAGNOSIS — N6459 Other signs and symptoms in breast: Secondary | ICD-10-CM

## 2015-01-24 LAB — COMPREHENSIVE METABOLIC PANEL
ALK PHOS: 68 U/L (ref 33–115)
ALT: 21 U/L (ref 6–29)
AST: 19 U/L (ref 10–30)
Albumin: 4.3 g/dL (ref 3.6–5.1)
BILIRUBIN TOTAL: 0.7 mg/dL (ref 0.2–1.2)
BUN: 9 mg/dL (ref 7–25)
CO2: 29 mmol/L (ref 20–31)
CREATININE: 0.58 mg/dL (ref 0.50–1.10)
Calcium: 9.6 mg/dL (ref 8.6–10.2)
Chloride: 100 mmol/L (ref 98–110)
GLUCOSE: 76 mg/dL (ref 65–99)
Potassium: 4 mmol/L (ref 3.5–5.3)
SODIUM: 138 mmol/L (ref 135–146)
TOTAL PROTEIN: 7.1 g/dL (ref 6.1–8.1)

## 2015-01-24 LAB — CBC WITH DIFFERENTIAL/PLATELET
BASOS ABS: 0 10*3/uL (ref 0.0–0.1)
BASOS PCT: 0 % (ref 0–1)
Eosinophils Absolute: 0.2 10*3/uL (ref 0.0–0.7)
Eosinophils Relative: 2 % (ref 0–5)
HCT: 41.4 % (ref 36.0–46.0)
HEMOGLOBIN: 14 g/dL (ref 12.0–15.0)
Lymphocytes Relative: 35 % (ref 12–46)
Lymphs Abs: 3.6 10*3/uL (ref 0.7–4.0)
MCH: 30.1 pg (ref 26.0–34.0)
MCHC: 33.8 g/dL (ref 30.0–36.0)
MCV: 89 fL (ref 78.0–100.0)
MONO ABS: 0.4 10*3/uL (ref 0.1–1.0)
MPV: 9.7 fL (ref 8.6–12.4)
Monocytes Relative: 4 % (ref 3–12)
NEUTROS ABS: 6.1 10*3/uL (ref 1.7–7.7)
NEUTROS PCT: 59 % (ref 43–77)
Platelets: 324 10*3/uL (ref 150–400)
RBC: 4.65 MIL/uL (ref 3.87–5.11)
RDW: 13 % (ref 11.5–15.5)
WBC: 10.4 10*3/uL (ref 4.0–10.5)

## 2015-01-24 LAB — HEMOGLOBIN A1C
HEMOGLOBIN A1C: 5.3 % (ref <5.7)
MEAN PLASMA GLUCOSE: 105 mg/dL (ref <117)

## 2015-01-24 LAB — LIPID PANEL
CHOL/HDL RATIO: 5.9 ratio — AB (ref <=5.0)
Cholesterol: 202 mg/dL — ABNORMAL HIGH (ref 125–200)
HDL: 34 mg/dL — ABNORMAL LOW (ref >=46)
Triglycerides: 524 mg/dL — ABNORMAL HIGH (ref <150)

## 2015-01-24 LAB — TSH: TSH: 1.357 u[IU]/mL (ref 0.350–4.500)

## 2015-01-24 NOTE — Progress Notes (Signed)
Subjective:   HPI  Victoria Wilson is a 29 y.o. female who presents for a complete physical.   Preventative care: Last ophthalmology visit: sees yearly Last dental visit: sees yearly  Prior vaccinations: TD or Tdap: 2011 Influenza:yearly at work Has completed Hep A and Hep B series  Concerns: PCOS - has been on OCPs prior, Metformin, last ultrasound few years ago.  Was seeing Dr. Eustaquio Boyden.  Has questions about her mood, behavior, OCD behavior, anxiety.  Lately for months seems more irritable, gets frustrated at things easily.    Gets obsessed with house being orderly.  recently hosted a birthday party for her niece and every little thing someone did got on her nerves.   She ended up having a crying spell over being frustrated.  Gets irritable at boyfriend at times . Doesn't think she has ADD, and denies depressed mood.  .novo  Reviewed their medical, surgical, family, social, medication, and allergy history and updated chart as appropriate.  Past Medical History  Diagnosis Date  . Allergy   . Obesity   . Hypertriglyceridemia   . Wears glasses   . Irregular periods   . History of migraine headaches   . Depression     dysthymia    Past Surgical History  Procedure Laterality Date  . Wisdom tooth extraction      Social History   Social History  . Marital Status: Single    Spouse Name: N/A  . Number of Children: N/A  . Years of Education: N/A   Occupational History  . Not on file.   Social History Main Topics  . Smoking status: Former Smoker    Quit date: 02/25/2009  . Smokeless tobacco: Never Used  . Alcohol Use: No  . Drug Use: No  . 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 Not on file   Social History Narrative   Lives with fiance, Ephriam Knuckles, exercising 2 days per week, is a surgical tech at [employer]    Family History  Problem Relation Age of Onset   . Alcohol abuse Father   . Heart disease Maternal Aunt   . Hypothyroidism Sister   . Hyperlipidemia Maternal Grandmother   . Hypertension Maternal Grandmother   . Diabetes Maternal Grandmother   . Cancer Maternal Grandmother     cervical, lung  . Hyperlipidemia Maternal Grandfather   . Hypertension Maternal Grandfather   . Cancer Maternal Grandfather     tongue, prostate     Current outpatient prescriptions:  .  sertraline (ZOLOFT) 50 MG tablet, Take 1 tablet (50 mg total) by mouth daily., Disp: 90 tablet, Rfl: 0  Allergies  Allergen Reactions  . Amoxicillin   . Citalopram     Bad nightmares  . Clindamycin/Lincomycin   . Penicillins   . Sulfa Antibiotics     Review of Systems Constitutional: -fever, -chills, -sweats, -unexpected weight change, -decreased appetite, -fatigue Allergy: -sneezing, -itching, -congestion Dermatology: -changing moles, --rash, -lumps ENT: -runny nose, -ear pain, -sore throat, -hoarseness, -sinus pain, -teeth pain, - ringing in ears, -hearing loss, -nosebleeds Cardiology: -chest pain, -palpitations, -swelling, -difficulty breathing when lying flat, -waking up short of breath Respiratory: -cough, -shortness of breath, -difficulty breathing with exercise or exertion, -wheezing, -coughing up blood Gastroenterology: -abdominal pain, -nausea, -vomiting, -diarrhea, -constipation, -blood in stool, -changes in bowel movement, -difficulty swallowing or eating Hematology: -bleeding, -bruising  Musculoskeletal: -joint aches, -muscle aches, -joint swelling, -back pain, -neck pain, -cramping, -changes in gait Ophthalmology: denies vision changes, eye redness, itching, discharge Urology: -burning with urination, -difficulty urinating, -blood in urine, -urinary frequency, -urgency, -incontinence Neurology: +headache, -weakness, -tingling, -numbness, -memory loss, -falls, -dizziness Psychology: -depressed mood, +agitation, -sleep problems     Objective:    Physical Exam  BP 122/80 mmHg  Pulse 74  Temp(Src) 98.4 F (36.9 C) (Oral)  Resp 18  Ht  (1.651 m)  Wt 272 lb (123.378 kg)  BMI 45.26 kg/m2  General appearance: alert, no distress, WD/WN, obese white female Skin: scar of posterior right forearm from childhood trauma, mild sunburn of chest ,otherwise scattered macules, no worrisome lesions HEENT: normocephalic, conjunctiva/corneas normal, sclerae anicteric, PERRLA, EOMi, nares patent, no discharge or erythema, pharynx normal Oral cavity: MMM, tongue normal, teeth in good repair Neck: supple, no lymphadenopathy, no thyromegaly, no masses, normal ROM, no bruits Chest: non tender, normal shape and expansion Heart: RRR, normal S1, S2, no murmurs Lungs: CTA bilaterally, no wheezes, rhonchi, or rales Abdomen: +bs, soft, non tender, non distended, no masses, no hepatomegaly, no splenomegaly, no bruits Back: non tender, normal ROM, no scoliosis Musculoskeletal: upper extremities non tender, no obvious deformity, normal ROM throughout, lower extremities non tender, no obvious deformity, normal ROM throughout Extremities: no edema, no cyanosis, no clubbing Pulses: 2+ symmetric, upper and lower extremities, normal cap refill Neurological: alert, oriented x 3, CN2-12 intact, strength normal upper extremities and lower extremities, sensation normal throughout, DTRs 2+ throughout, no cerebellar signs, gait normal Psychiatric: normal affect, behavior normal, pleasant  Breast: nontender, left breast/upper chest wall at 10 o'clock with 1.5 cm oval lump vs dense tissue, somewhat subtle findings.   otherwise no other masses or lumps, no skin changes, no nipple discharge or inversion, no axillary lymphadenopathy Gyn: Normal external genitalia without lesions, vagina with normal mucosa, cervix without lesions, no cervical motion tenderness, no abnormal vaginal discharge.  Uterus and adnexa not enlarged, non tender, no masses.  Pap performed.  Exam  chaperoned by nurse. Rectal: deferred   Assessment and Plan :    Encounter Diagnoses  Name Primary?  . Encounter for health maintenance examination in adult Yes  . Morbid obesity   . PCOS (polycystic ovarian syndrome)   . Screen for STD (sexually transmitted disease)   . Screening for cervical cancer   . Obsessive behavior   . Anxious mood   . Abnormal breast exam    Physical exam - discussed healthy lifestyle, diet, exercise, preventative care, vaccinations, and addressed their concerns.   Obesity - discussed need to work on weight loss through diet and exercise PCOS - discussed possible treatments, approaches to therapy, diagnosis and co morbidities STD screen today, cervical cancer screening today Lump in left breast vs mild asymmetry - likely benign, pending labs, consider options.  We discussed this briefly today  Obsessive behavior, anxious mood -  PHQ-9 questionnaire with score of 6 today. Mood disorder questionnaire today negative.  Anxiety score of 11, high.   Follow-up pending labs

## 2015-01-25 ENCOUNTER — Encounter: Payer: Self-pay | Admitting: Medical

## 2015-01-25 LAB — HIV ANTIBODY (ROUTINE TESTING W REFLEX): HIV 1&2 Ab, 4th Generation: NONREACTIVE

## 2015-01-25 LAB — RPR

## 2015-01-26 LAB — CYTOLOGY - PAP

## 2016-08-16 ENCOUNTER — Ambulatory Visit (INDEPENDENT_AMBULATORY_CARE_PROVIDER_SITE_OTHER): Payer: BLUE CROSS/BLUE SHIELD | Admitting: Medical

## 2016-08-16 ENCOUNTER — Encounter: Payer: Self-pay | Admitting: Medical

## 2016-08-16 VITALS — BP 128/74 | HR 80 | Wt 262.0 lb

## 2016-08-16 DIAGNOSIS — E781 Pure hyperglyceridemia: Secondary | ICD-10-CM | POA: Diagnosis not present

## 2016-08-16 DIAGNOSIS — E663 Overweight: Secondary | ICD-10-CM | POA: Diagnosis not present

## 2016-08-16 DIAGNOSIS — E786 Lipoprotein deficiency: Secondary | ICD-10-CM

## 2016-08-16 DIAGNOSIS — F4010 Social phobia, unspecified: Secondary | ICD-10-CM | POA: Insufficient documentation

## 2016-08-16 DIAGNOSIS — F419 Anxiety disorder, unspecified: Secondary | ICD-10-CM

## 2016-08-16 MED ORDER — ICOSAPENT ETHYL 1 G PO CAPS: 2.0000 | ORAL_CAPSULE | Freq: Two times a day (BID) | ORAL | 2 refills | Status: DC

## 2016-08-16 MED ORDER — VORTIOXETINE HBR 10 MG PO TABS: 1.0000 | ORAL_TABLET | Freq: Every day | ORAL | 1 refills | Status: DC

## 2016-08-16 NOTE — Progress Notes (Signed)
Subjective: Chief Complaint  Patient presents with  . discuss lab results    discuss lab results   Here for f/u on labs.  Recently had labs done at her employment and some labs still abnormal.  She has purposely tried to work on diet and exercise since last visit in 12/2014.  Has elevated TRIG.  No hx/o pancreatitis.  Drinks some alcohol.  Exercise - walking the dogs at least once daily.  Diet - much less red meat, but eats more [place].   Eats some fast food. Cooks at home most nights.     Still dealing with anxiety.  Gets panic feeling in crowds, even at work  In the past didn't do well on certain mediation.  Does feel she needs to be back on medications at this time.    Past Medical History:  Diagnosis Date  . Allergy   . Depression    dysthymia  . History of migraine headaches   . Hypertriglyceridemia   . Irregular periods   . Obesity   . PCOS (polycystic ovarian syndrome)   . Wears glasses     Past Surgical History:  Procedure Laterality Date  . WISDOM TOOTH EXTRACTION      Social History   Social History  . Marital status: Single    Spouse name: N/A  . Number of children: N/A  . Years of education: N/A   Occupational History  . Not on file.   Social History Main Topics  . Smoking status: Former Smoker    Quit date: 02/25/2009  . Smokeless tobacco: Never Used  . Alcohol use 0.6 oz/week    1 Cans of beer per week     Comment: rare  . Drug use: No  . 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 Alcohol abuse Father   . Heart disease Maternal Aunt   . Hyperlipidemia Maternal Aunt   . Hypothyroidism Sister   . Hyperlipidemia Maternal Grandmother   . Hypertension Maternal  Grandmother   . Diabetes Maternal Grandmother   . Cancer Maternal Grandmother     cervical, lung  . Hyperlipidemia Maternal Grandfather   . Hypertension Maternal Grandfather   . Cancer Maternal Grandfather     tongue, prostate  . Hyperlipidemia Paternal Grandmother   . Hyperlipidemia Paternal Grandfather      Current Outpatient Prescriptions:  .  Icosapent Ethyl (VASCEPA) 1 g CAPS, Take 2 capsules by mouth 2 (two) times daily., Disp: 120 capsule, Rfl: 2 .  vortioxetine HBr (TRINTELLIX) 10 MG TABS, Take 1 tablet (10 mg total) by mouth daily., Disp: 30 tablet, Rfl: 1  Allergies  Allergen Reactions  . Amoxicillin   . Citalopram     Bad nightmares  . Clindamycin/Lincomycin   . Penicillins   . Sulfa Antibiotics      Review of Systems Constitutional: -fever, -chills, -sweats, -unexpected weight change,-fatigue ENT: -runny nose, -ear pain, -sore throat Cardiology:  -chest pain, -palpitations, -edema Respiratory: -cough, -shortness of breath, -wheezing Gastroenterology: -abdominal pain, -nausea, -vomiting, -diarrhea, -constipation Hematology: -bleeding or bruising problems Musculoskeletal: -arthralgias, -myalgias, -joint swelling, -back pain Ophthalmology: -vision changes Urology: -dysuria, -difficulty urinating, -hematuria, -  urinary frequency, -urgency Neurology: -headache, -weakness, -tingling, -numbness    Objective: BP 128/74   Pulse 80   Wt 262 lb (118.8 kg)   SpO2 97%   BMI 43.60 kg/m   Wt Readings from Last 3 Encounters:  08/16/16 262 lb (118.8 kg)  01/24/15 272 lb (123.4 kg)  11/23/12 257 lb (116.6 kg)   General appearance: alert, no distress, WD/WN, obese white female Neck: supple, no lymphadenopathy, no thyromegaly, no masses Heart: RRR, normal S1, S2, no murmurs Lungs: CTA bilaterally, no wheezes, rhonchi, or rales Abdomen: +bs, soft, non tender, non distended, no masses, no hepatomegaly, no splenomegaly Pulses: 2+ symmetric, upper and lower extremities,  normal cap refill Ext: no edema    Assessment: Encounter Diagnoses  Name Primary?  . High blood triglycerides Yes  . Low HDL (under 40)   . Morbid obesity (HCC)   . Anxious mood   . Social anxiety disorder   . Overweight      Plan: Discussed her concerns  Elevated TRIG, low HDL , obesity - reviewed recent lipids with low HDL, TRIG >800. [facility]education counseling.   Begin trial of Vascepa.  Discussed possibly adding statin in the future.   She wants to avoid Nalspan as she already has hot flashes.   Discussed diet and exercise recommendations  Obesity - discussed risks, counseled on diet and exercise.   Referral as noted above  anxious mood, social anxiety - advised counseling.  She had bad experience with Zoloft and citalopram.  Want to avoid weight gain with other SSRI.  Begin trial of Trintellix.  discussed risks/benefits of medication, proper use of medication.      Patient Instructions  Check insurance coverage for the weight loss medications below:  Contrave  Qsymia  Belviq  Saxenda   Begin trial of Trintellix 5mg  daily for mood, anxiety. Consider counseling If after the samples things are going ok without side effects, then begin Trintellix 10mg  sent to pharmacy   Begin Vascepa prescription fish oil 2 capsules twice daily  We will refer you to the nurse fitness plan  Lets recheck in 6 weeks    Vernona RiegerLaura was seen today for discuss lab results.  Diagnoses and all orders for this visit:  High blood triglycerides  Low HDL (under 40)  Morbid obesity (HCC)  Anxious mood  Social anxiety disorder  Overweight -     Amb Referral To Provider Referral Exercise Program (P.R.E.P)  Other orders -     Icosapent Ethyl (VASCEPA) 1 g CAPS; Take 2 capsules by mouth 2 (two) times daily. -     vortioxetine HBr (TRINTELLIX) 10 MG TABS; Take 1 tablet (10 mg total) by mouth daily.  Spent > 45 minutes face to face with patient in  discussion of symptoms, evaluation, plan and recommendations.

## 2016-08-16 NOTE — Patient Instructions (Addendum)
Check insurance coverage for the weight loss medications below:  Contrave  Qsymia  Belviq  Saxenda   Begin trial of Trintellix 5mg  daily for mood, anxiety. Consider counseling If after the samples things are going ok without side effects, then begin Trintellix 10mg  sent to pharmacy   Begin Vascepa prescription fish oil 2 capsules twice daily  We will refer you to the nurse fitness plan  Lets recheck in 6 weeks

## 2016-08-17 ENCOUNTER — Telehealth: Payer: Self-pay | Admitting: Medical

## 2016-08-17 NOTE — Telephone Encounter (Signed)
P.A. TRINTELLIX °

## 2016-08-22 ENCOUNTER — Other Ambulatory Visit: Payer: Self-pay

## 2016-08-22 MED ORDER — FENOFIBRATE 145 MG PO TABS: 145.0000 mg | ORAL_TABLET | Freq: Every day | ORAL | 2 refills | Status: DC

## 2016-08-22 MED ORDER — SERTRALINE HCL 50 MG PO TABS: 50.0000 mg | ORAL_TABLET | Freq: Every day | ORAL | 1 refills | Status: DC

## 2016-08-22 MED ORDER — VORTIOXETINE HBR 10 MG PO TABS: 10.0000 mg | ORAL_TABLET | Freq: Once | ORAL | 0 refills | Status: AC

## 2016-08-22 MED ORDER — ICOSAPENT ETHYL 1 G PO CAPS: 2.0000 | ORAL_CAPSULE | Freq: Two times a day (BID) | ORAL | 0 refills | Status: DC

## 2016-08-22 NOTE — Telephone Encounter (Signed)
Called pt and she states she can't afford the Trintellix and wants to switch back to Sertraline 50mg  qd, states has tried this in the past and it worked for her (note in chart that she had bad side effect she said was incorrect, said no side effects with this medication).  Also states that Vascepa also is too expensive over $200 and can't afford, wants to switch to generic cholesterol medicine that she can afford, says never tried Statin.  [facility] switch [facility]

## 2016-08-22 NOTE — Telephone Encounter (Signed)
P.A. Approved, called pharmacy, went thru but $372, pt has high deductible.  Went online & activated discount card.  Called pharmacy back and went down to $273 for 30 days.

## 2016-08-22 NOTE — Telephone Encounter (Signed)
Have her follow up with Vincenza HewsShane in a month

## 2016-08-24 ENCOUNTER — Telehealth: Payer: Self-pay | Admitting: Medical

## 2016-08-24 NOTE — Telephone Encounter (Signed)
Called pharmacy & sertraline went thru for 75 cents, Fenofibrate is $19, but won't be in until Monday.  Left message for pt

## 2016-08-25 NOTE — Telephone Encounter (Signed)
Already left message for pt.

## 2016-09-20 ENCOUNTER — Telehealth: Payer: Self-pay

## 2016-09-20 NOTE — Telephone Encounter (Signed)
Left a text message on Pt's cell number regarding the PREP she was referred in to.  I have also called and left VM on cell number on 4/9 w/o response.  Will follow up again next week.

## 2016-09-30 ENCOUNTER — Ambulatory Visit: Payer: BLUE CROSS/BLUE SHIELD | Admitting: Medical

## 2016-10-04 ENCOUNTER — Encounter: Payer: Self-pay | Admitting: Medical

## 2016-10-04 ENCOUNTER — Ambulatory Visit (INDEPENDENT_AMBULATORY_CARE_PROVIDER_SITE_OTHER): Payer: BLUE CROSS/BLUE SHIELD | Admitting: Medical

## 2016-10-04 ENCOUNTER — Telehealth: Payer: Self-pay | Admitting: Medical

## 2016-10-04 VITALS — BP 134/72 | HR 101 | Wt 254.0 lb

## 2016-10-04 DIAGNOSIS — E781 Pure hyperglyceridemia: Secondary | ICD-10-CM

## 2016-10-04 DIAGNOSIS — E786 Lipoprotein deficiency: Secondary | ICD-10-CM | POA: Diagnosis not present

## 2016-10-04 DIAGNOSIS — F419 Anxiety disorder, unspecified: Secondary | ICD-10-CM

## 2016-10-04 DIAGNOSIS — F4010 Social phobia, unspecified: Secondary | ICD-10-CM | POA: Diagnosis not present

## 2016-10-04 NOTE — Progress Notes (Signed)
Subjective: Chief Complaint  Patient presents with  . Follow-up    follow up ,diaherra some from meds.    Here for f/u.  I last saw her 08/16/16 regarding elevated triglycerides >800, social anxiety, and after last visit she has trouble with insurance coverage for both Trintellix for mood and Vascepa for triglycerides.   She has worked to lose weight since last visit and has lost some weight.   She is currently taking Fenofibrate for lipids since Vascepa wasn't covered.   She went back on Sertraline since trintellix was too expensive and mood has been doing ok.  Exercise - walking the dogs at least once daily.   Gets panic feeling in crowds, even at work  In the past didn't do well on certain mediations.  No other aggravating or relieving factors. No other complaint.   Past Medical History:  Diagnosis Date  . Allergy   . Depression    dysthymia  . History of migraine headaches   . Hypertriglyceridemia   . Irregular periods   . Obesity   . PCOS (polycystic ovarian syndrome)   . Wears glasses     Past Surgical History:  Procedure Laterality Date  . WISDOM TOOTH EXTRACTION      Social History   Social History  . Marital status: Single    Spouse name: N/A  . Number of children: N/A  . Years of education: N/A   Occupational History  . Not on file.   Social History Main Topics  . Smoking status: Former Smoker    Quit date: 02/25/2009  . Smokeless tobacco: Never Used  . Alcohol use 0.6 oz/week    1 Cans of beer per week     Comment: rare  . Drug use: No  . 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 Not on file   Social History Narrative   Lives with fiance, Ephriam Knuckles, exercising 2 days per week, is a surgical tech at [employer]    Family History  Problem Relation Age of Onset  . Alcohol abuse Father   . Heart disease Maternal Aunt   . Hyperlipidemia Maternal Aunt    . Hypothyroidism Sister   . Hyperlipidemia Maternal Grandmother   . Hypertension Maternal Grandmother   . Diabetes Maternal Grandmother   . Cancer Maternal Grandmother        cervical, lung  . Hyperlipidemia Maternal Grandfather   . Hypertension Maternal Grandfather   . Cancer Maternal Grandfather        tongue, prostate  . Hyperlipidemia Paternal Grandmother   . Hyperlipidemia Paternal Grandfather      Current Outpatient Prescriptions:  .  fenofibrate (TRICOR) 145 MG tablet, Take 1 tablet (145 mg total) by mouth daily., Disp: 30 tablet, Rfl: 2 .  sertraline (ZOLOFT) 50 MG tablet, Take 1 tablet (50 mg total) by mouth daily., Disp: 30 tablet, Rfl: 1  Allergies  Allergen Reactions  . Amoxicillin   . Citalopram     Bad nightmares  . Clindamycin/Lincomycin   . Penicillins   . Sulfa Antibiotics     Review of Systems Constitutional: -fever, -chills, -sweats, -unexpected weight change,-fatigue ENT: -runny nose, -ear pain, -sore throat Cardiology:  -chest pain, -palpitations, -edema Respiratory: -cough, -shortness of breath, -wheezing Gastroenterology: -abdominal pain, -nausea, -vomiting, -diarrhea, -constipation  Hematology: -bleeding or bruising problems Musculoskeletal: -arthralgias, -myalgias, -joint swelling, -back pain  Ophthalmology: -vision changes Urology: -dysuria, -difficulty urinating, -hematuria, -urinary frequency, -urgency Neurology: -headache, -weakness, -tingling, -numbness     Objective: BP 134/72   Pulse (!) 101   Wt 254 lb (115.2 kg)   BMI 42.27 kg/m   Wt Readings from Last 3 Encounters:  10/04/16 254 lb (115.2 kg)  08/16/16 262 lb (118.8 kg)  01/24/15 272 lb (123.4 kg)   General appearance: alert, no distress, WD/WN, obese white female Neck: supple, no lymphadenopathy, no thyromegaly, no masses Heart: RRR, normal S1, S2, no murmurs Lungs: CTA bilaterally, no wheezes, rhonchi, or rales Abdomen: +bs, soft, non tender, non distended, no masses, no  hepatomegaly, no splenomegaly Pulses: 2+ symmetric, upper and lower extremities, normal cap refill Ext: no edema    Assessment: Encounter Diagnoses  Name Primary?  . High blood triglycerides Yes  . Low HDL (under 40)   . Anxious mood   . Social anxiety disorder   . Morbid obesity (HCC)      Plan: Elevated TRIG, low HDL -c/t fenofibrate.  Return in the summer for fasting labs and physical.  c/t working on healthy diet and exercise  Obesity - discussed risks, counseled on diet and exercise.   Referral to [employer] weight loss study  Anxious mood, social anxiety - doing ok on sertraline.  discussed risks/benefits of medication, proper use of medication.      Vernona RiegerLaura was seen today for follow-up.  Diagnoses and all orders for this visit:  High blood triglycerides  Low HDL (under 40)  Anxious mood  Social anxiety disorder  Morbid obesity (HCC)

## 2016-10-04 NOTE — Telephone Encounter (Signed)
Refer to [employer] weight loss study

## 2016-10-07 ENCOUNTER — Telehealth: Payer: Self-pay | Admitting: Medical

## 2016-10-07 NOTE — Telephone Encounter (Signed)
Sent referral to [employer]

## 2016-10-07 NOTE — Telephone Encounter (Addendum)
Called pharmacy to inquire about Vascepa & it did go thru insurance but cost $274 for 30 days.  ActIvated discount card and went down to $204 a month.  Called Rep Joan MayansLindsay Rogers & we looked at pt's deductible and pt has high deductible.  Vascepa is covered but until deductible is paid which is still over $5000, cost will be $204 a month. Called pt and informed,  She didn't realize her deductive was that high and will check with her job about that.

## 2016-10-08 NOTE — Telephone Encounter (Signed)
Ok.  I wanted to see where this led given that the reps always say it should be relatively cheap on private insurance.   Thanks for checking on this

## 2016-10-23 ENCOUNTER — Other Ambulatory Visit: Payer: Self-pay

## 2016-10-23 ENCOUNTER — Other Ambulatory Visit: Payer: Self-pay | Admitting: Family Medicine

## 2016-10-23 ENCOUNTER — Telehealth: Payer: Self-pay | Admitting: Family Medicine

## 2016-10-23 MED ORDER — FENOFIBRATE 145 MG PO TABS: 145.0000 mg | ORAL_TABLET | Freq: Every day | ORAL | 0 refills | Status: DC

## 2016-10-23 NOTE — Telephone Encounter (Signed)
Recv'd fax stating Insurance [facility] 90 [facility]

## 2016-10-23 NOTE — Telephone Encounter (Signed)
Is this okay to refill? 

## 2016-10-23 NOTE — Telephone Encounter (Signed)
This is not our pt

## 2016-10-23 NOTE — Telephone Encounter (Signed)
Sent in refill to pharmacy.

## 2016-10-24 ENCOUNTER — Other Ambulatory Visit: Payer: Self-pay | Admitting: Family Medicine

## 2016-10-24 MED ORDER — FENOFIBRATE 145 MG PO TABS: 145.0000 mg | ORAL_TABLET | Freq: Every day | ORAL | 0 refills | Status: DC

## 2016-12-10 ENCOUNTER — Encounter: Payer: BLUE CROSS/BLUE SHIELD | Admitting: Medical

## 2016-12-25 ENCOUNTER — Encounter: Payer: BLUE CROSS/BLUE SHIELD | Admitting: Medical

## 2017-01-10 ENCOUNTER — Encounter: Payer: BLUE CROSS/BLUE SHIELD | Admitting: Medical

## 2017-01-16 ENCOUNTER — Encounter: Payer: Self-pay | Admitting: Medical

## 2017-01-24 ENCOUNTER — Encounter: Payer: BLUE CROSS/BLUE SHIELD | Admitting: Medical

## 2017-02-12 ENCOUNTER — Encounter: Payer: BLUE CROSS/BLUE SHIELD | Admitting: Medical

## 2019-03-08 DIAGNOSIS — M25562 Pain in left knee: Secondary | ICD-10-CM | POA: Diagnosis not present

## 2019-03-08 DIAGNOSIS — R0789 Other chest pain: Secondary | ICD-10-CM | POA: Diagnosis not present

## 2020-02-29 DIAGNOSIS — L03116 Cellulitis of left lower limb: Secondary | ICD-10-CM | POA: Diagnosis not present

## 2020-03-04 ENCOUNTER — Other Ambulatory Visit: Payer: Self-pay

## 2020-03-04 ENCOUNTER — Encounter (HOSPITAL_COMMUNITY): Payer: Self-pay | Admitting: *Deleted

## 2020-03-04 ENCOUNTER — Emergency Department (HOSPITAL_COMMUNITY)
Admission: EM | Admit: 2020-03-04 | Discharge: 2020-03-04 | Disposition: A | Discharge status: Home / Self Care | Payer: BC Managed Care – PPO | Attending: Emergency Medicine | Admitting: Emergency Medicine

## 2020-03-04 DIAGNOSIS — R2242 Localized swelling, mass and lump, left lower limb: Secondary | ICD-10-CM | POA: Diagnosis not present

## 2020-03-04 DIAGNOSIS — Z87891 Personal history of nicotine dependence: Secondary | ICD-10-CM | POA: Insufficient documentation

## 2020-03-04 DIAGNOSIS — Z79899 Other long term (current) drug therapy: Secondary | ICD-10-CM | POA: Diagnosis not present

## 2020-03-04 DIAGNOSIS — L03119 Cellulitis of unspecified part of limb: Secondary | ICD-10-CM

## 2020-03-04 DIAGNOSIS — L03032 Cellulitis of left toe: Secondary | ICD-10-CM | POA: Insufficient documentation

## 2020-03-04 DIAGNOSIS — L03116 Cellulitis of left lower limb: Secondary | ICD-10-CM | POA: Diagnosis not present

## 2020-03-04 MED ORDER — LIDOCAINE HCL (PF) 1 % IJ SOLN
5.0000 mL | Freq: Once | INTRAMUSCULAR | Status: AC
  Administered 2020-03-04: 5 mL
  Filled 2020-03-04: qty 30

## 2020-03-04 MED ORDER — LIDOCAINE-EPINEPHRINE-TETRACAINE (LET) TOPICAL GEL
3.0000 mL | Freq: Once | TOPICAL | Status: AC
  Administered 2020-03-04: 3 mL via TOPICAL
  Filled 2020-03-04: qty 3

## 2020-03-04 MED ORDER — CLINDAMYCIN HCL 300 MG PO CAPS
600.0000 mg | ORAL_CAPSULE | Freq: Once | ORAL | Status: AC
Start: 2020-03-04 — End: 2020-03-04
  Administered 2020-03-04: 600 mg via ORAL
  Filled 2020-03-04: qty 2

## 2020-03-04 MED ORDER — CLINDAMYCIN HCL 300 MG PO CAPS: 300.0000 mg | ORAL_CAPSULE | Freq: Four times a day (QID) | ORAL | 0 refills | Status: AC

## 2020-03-04 NOTE — ED Triage Notes (Signed)
Left 3rd toe ? Spider bite. Went to [facility] Tuesday was given steroids and antibiotics. Toe draining, red and swollen around area.

## 2020-03-04 NOTE — ED Provider Notes (Signed)
[facility]-EMERGENCY DEPT Provider Note   CSN: [id number] Arrival date & time: 03/04/20  1937     History Chief Complaint  Patient presents with   Insect Bite    Victoria Wilson is a 34 y.o. female.  34 year old female presents with complaint of left 3rd toe infection. Patient went to urgent care 4 days ago, was given a dose or prednisone in the office and 5 days of Doxycycline. Patient has also been soaking her foot in warm Epsom salt, states the scab on the wound comes off when she soaks and has trace purulent drainage. Reports swelling and redness have improved somewhat but is supposed to finish her antibiotic today and the toe/foot still appears infected, may need I&D. No complaints or fevers, no other complaints or concerns.         Past Medical History:  Diagnosis Date   Allergy    Depression    dysthymia   History of migraine headaches    Hypertriglyceridemia    Irregular periods    Obesity    PCOS (polycystic ovarian syndrome)    Wears glasses     Patient Active Problem List   Diagnosis Date Noted   Low HDL (under 40) 08/16/2016   High blood triglycerides 08/16/2016   Social anxiety disorder 08/16/2016   PCOS (polycystic ovarian syndrome) 01/24/2015   Obsessive behavior 01/24/2015   Anxious mood 01/24/2015   Encounter for health maintenance examination in adult 01/24/2015   Morbid obesity (HCC) 01/08/2010    Past Surgical History:  Procedure Laterality Date   WISDOM TOOTH EXTRACTION       OB History   No obstetric history on file.     Family History  Problem Relation Age of Onset   Alcohol abuse Father    Heart disease Maternal Aunt    Hyperlipidemia Maternal Aunt    Hypothyroidism Sister    Hyperlipidemia Maternal Grandmother    Hypertension Maternal Grandmother    Diabetes Maternal Grandmother    Cancer Maternal Grandmother        cervical, lung   Hyperlipidemia Maternal Grandfather     Hypertension Maternal Grandfather    Cancer Maternal Grandfather        tongue, prostate   Hyperlipidemia Paternal Grandmother    Hyperlipidemia Paternal Grandfather     Social History   Tobacco Use   Smoking status: Former Smoker    Quit date: 02/25/2009    Years since quitting: 11.0   Smokeless tobacco: Never Used  Substance Use Topics   Alcohol use: Yes    Alcohol/week: 1.0 standard drink    Types: 1 Cans of beer per week    Comment: rare   Drug use: No    Home Medications Prior to Admission medications   Medication Sig Start Date End Date Taking? Authorizing Provider  clindamycin (CLEOCIN) 300 MG capsule Take 1 capsule (300 mg total) by mouth 4 (four) times daily for 10 days. 03/04/20 03/14/20  Jeannie Fend, PA-C  fenofibrate (TRICOR) 145 MG tablet Take 1 tablet (145 mg total) by mouth daily. 10/24/16   Ronnald Nian, MD  sertraline (ZOLOFT) 50 MG tablet TAKE 1 TABLET (50 MG TOTAL) BY MOUTH DAILY. 10/23/16   Tysinger, Kermit Balo, PA-C    Allergies    Amoxicillin, Citalopram, Clindamycin/lincomycin, Penicillins, and Sulfa antibiotics  Review of Systems   Review of Systems  Constitutional: Negative for fever.  Musculoskeletal: Positive for joint swelling.  Skin: Positive for color change and  wound.  Allergic/Immunologic: Negative for immunocompromised state.  Neurological: Negative for weakness and numbness.  Hematological: Negative for adenopathy.  All other systems reviewed and are negative.   Physical Exam Updated Vital Signs BP 132/73    Pulse 63    Temp 98.1 F (36.7 C) (Oral)    Resp 16    Ht 5\' 4"  (1.626 m)    Wt 95.3 kg    SpO2 100%    BMI 36.05 kg/m   Physical Exam Vitals and nursing note reviewed.  Constitutional:      General: She is not in acute distress.    Appearance: She is well-developed. She is not diaphoretic.  HENT:     Head: Normocephalic and atraumatic.  Cardiovascular:     Pulses: Normal pulses.  Pulmonary:     Effort: Pulmonary  effort is normal.  Musculoskeletal:        General: Swelling present.  Skin:    General: Skin is warm and dry.     Findings: Erythema present.     Comments: Swelling of the 3rd toe left foot, with erythema, proximal eschar to dorsum of the toe with erythema to the dorsum of the left foot with mild to moderate swelling.   Neurological:     Mental Status: She is alert and oriented to person, place, and time.     Sensory: No sensory deficit.  Psychiatric:        Behavior: Behavior normal.     ED Results / Procedures / Treatments   Labs (all labs ordered are listed, but only abnormal results are displayed) Labs Reviewed - No data to display  EKG None  Radiology No results found.  Procedures . Incision and Drainage  Date/Time: 03/04/2020 9:45 AM Performed by: 05/04/2020, PA-C Authorized by: Jeannie Fend, PA-C   Consent:    Consent obtained:  Verbal   Consent given by:  Patient   Risks discussed:  Bleeding, incomplete drainage, pain and damage to other organs   Alternatives discussed:  No treatment Universal protocol:    Procedure explained and questions answered to patient or proxy's satisfaction: yes     Relevant documents present and verified: yes     Test results available and properly labeled: yes     Imaging studies available: yes     Required blood products, implants, devices, and special equipment available: yes     Site/side marked: yes     Immediately prior to procedure a time out was called: yes     Patient identity confirmed:  Verbally with patient Location:    Type:  Abscess   Size:  65mm x 57mm   Location:  Lower extremity   Lower extremity location:  Toe   Toe location:  L third toe Pre-procedure details:    Skin preparation:  Betadine Anesthesia (see MAR for exact dosages):    Anesthesia method:  Local infiltration and topical application   Topical anesthetic:  LET   Local anesthetic:  Lidocaine 1% w/o epi Procedure type:    Complexity:   Simple Procedure details:    Incision types:  Single straight   Incision depth:  Subcutaneous   Scalpel blade:  11   Wound management:  Probed and deloculated, irrigated with saline and extensive cleaning   Drainage:  Purulent   Drainage amount:  Scant   Packing materials:  None Post-procedure details:    Patient tolerance of procedure:  Tolerated well, no immediate complications   (including critical care time)  Medications  Ordered in ED Medications  lidocaine-EPINEPHrine-tetracaine (LET) topical gel (has no administration in time range)  lidocaine (PF) (XYLOCAINE) 1 % injection 5 mL (has no administration in time range)  clindamycin (CLEOCIN) capsule 600 mg (has no administration in time range)    ED Course  I have reviewed the triage vital signs and the nursing notes.  Pertinent labs & imaging results that were available during my care of the patient were reviewed by me and considered in my medical decision making (see chart for details).  Clinical Course as of Mar 04 949  Sat Mar 04, 2020  239 34 year old female with cellulitis/abscess left 3rd toe/dorsum of foot. Patient has had 4 days of Doxycycline, some improvement. On exam, cellulitis to dorsum of the foot, purulent cellulitis vs abscess of the left 3rd toe. I&D with scant purulent drainage. Discussed with Dr. Rush Landmark, ER attending, agrees with plan for dc with Clindamycin (listed as allergy, not allergic per patient), continue with warm compresses. Return to ER for worsening or concerning symptoms otherwise recheck with podiatry on Monday.    [LM]    Clinical Course User Index [LM] Alden Hipp   MDM Rules/Calculators/A&P                          Final Clinical Impression(s) / ED Diagnoses Final diagnoses:  Cellulitis of foot    Rx / DC Orders ED Discharge Orders         Ordered    clindamycin (CLEOCIN) 300 MG capsule  4 times daily        03/04/20 0927           Jeannie Fend, PA-C 03/04/20  0950    Tegeler, Canary Brim, MD 03/04/20 1343

## 2020-03-04 NOTE — Discharge Instructions (Signed)
Take Clindamycin as prescribed as discussed. Continue with warm compresses at home. Follow up with podiatry on Monday if not improving. Return to the ER at any time for fever, worsening or concerning symptoms.

## 2021-03-19 ENCOUNTER — Other Ambulatory Visit (HOSPITAL_BASED_OUTPATIENT_CLINIC_OR_DEPARTMENT_OTHER): Payer: Self-pay

## 2021-03-19 DIAGNOSIS — M79644 Pain in right finger(s): Secondary | ICD-10-CM | POA: Diagnosis not present

## 2021-05-07 DIAGNOSIS — M79642 Pain in left hand: Secondary | ICD-10-CM | POA: Diagnosis not present

## 2021-05-07 DIAGNOSIS — M7989 Other specified soft tissue disorders: Secondary | ICD-10-CM | POA: Diagnosis not present

## 2021-05-07 DIAGNOSIS — M25562 Pain in left knee: Secondary | ICD-10-CM | POA: Diagnosis not present

## 2021-05-07 DIAGNOSIS — M79641 Pain in right hand: Secondary | ICD-10-CM | POA: Diagnosis not present

## 2021-05-07 DIAGNOSIS — R768 Other specified abnormal immunological findings in serum: Secondary | ICD-10-CM | POA: Diagnosis not present

## 2021-05-07 DIAGNOSIS — M359 Systemic involvement of connective tissue, unspecified: Secondary | ICD-10-CM | POA: Diagnosis not present

## 2021-05-07 DIAGNOSIS — M25561 Pain in right knee: Secondary | ICD-10-CM | POA: Diagnosis not present

## 2024-04-26 NOTE — Progress Notes (Unsigned)
 OFFICE NOTE:    Date:  04/27/2024  ID:  Victoria Wilson, DOB 1985/12/20, MRN [id number] PCP: Rosalea Rosina SAILOR, PA  [facility] Providers Cardiologist:  None        Hypertriglyceridemia Off meds  Obesity PCOS Depression, anxiety        Discussed the use of AI scribe software for clinical note transcription with the patient, who gave verbal consent to proceed. History of Present Illness Victoria Wilson is a 39 y.o. female referred by Rosalea Rosina SAILOR, PA for palpitations, near syncope.   She has experienced heart palpitations for most of her life, often associated with feelings of claustrophobia. During these episodes, she feels like she is about to pass out, with symptoms including sounds fading away, lights becoming very bright, lips turning purple, and a sensation of impending fainting. She has never actually fainted. These episodes occur mostly with standing and are accompanied by an increased heart rate. She experiences dizziness on some days when standing up, but not consistently.    She experiences shortness of breath occasionally, which she considers normal for her. She can climb a flight of stairs but feels out of breath at the top. She reports occasional chest discomfort on the right side, lasting only a second or two, and not associated with exertion. She also experiences ankle swelling at times, which resolves after a day or two of elevation.  Her family history is significant for heart disease. Her maternal aunt has had open heart surgery and a pacemaker, and her father underwent triple bypass surgery at the age of 100 or 90.    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 She does not consume alcohol or use drugs. She vapes but has never smoked cigarettes.   ROS-See HPI     Studies Reviewed:      Results EKG: NSR, heart rate 83 bpm, QTc 398 ms, normal axis, no ST-T wave changes (04/20/2024)  Risk  Assessment/Calculations:         Physical Exam:  VS:  BP 98/63   Pulse 84   Ht 5' 4 (1.626 m)   Wt 244 lb 6.4 oz (110.9 kg)   SpO2 94%   BMI 41.95 kg/m     Orthostatic VS for the past 24 hrs (Last 3 readings):  BP- Lying Pulse- Lying BP- Sitting Pulse- Sitting BP- Standing at 0 minutes Pulse- Standing at 0 minutes BP- Standing at 3 minutes Pulse- Standing at 3 minutes  04/27/24 0940 103/63 77 107/73 81 106/73 83 104/71 83    Wt Readings from Last 3 Encounters:  04/27/24 244 lb 6.4 oz (110.9 kg)  03/04/20 210 lb (95.3 kg)  10/04/16 254 lb (115.2 kg)    Constitutional:      Appearance: Healthy appearance. Not in distress.  Neck:     Vascular: JVD normal.  Pulmonary:     Breath sounds: Normal breath sounds. No wheezing. No rales.  Cardiovascular:     Normal rate. Regular rhythm.     Murmurs: There is no murmur.  Edema:    Peripheral edema absent.  Abdominal:     Palpations: Abdomen is soft.       Assessment and Plan:    Assessment & Plan Palpitations Near syncope Shortness of breath She mainly notes symptoms of hear racing and near syncope, which has been a symptom for her for most of her life. Her BP tends to run on the lower side (90s).  She has never passed out. She notes some shortness of breath with exertion at times but no chest pain to suggest angina. Orthostatic VS today are normal. There is no current objective evidence of POTS (Postural Orthostatic Hypotension). Based on her symptoms, she may be chronically volume deplete. Given her symptoms, I have recommend getting an echocardiogram to rule out structural heart disease and a 2 week monitor to rule out significant arrhythmias. - Echocardiogram - 2 week Zio XT monitor - Request labs from primary care - Push fluids, wear compression stockings and liberalize salt to help with symptoms - Follow up after testing in 3 mos        Dispo:  Return in about 3 months (around 07/26/2024) for Follow up after testing, w/  Glendia Ferrier, PA-C.  Signed, Glendia Ferrier, PA-C

## 2024-04-27 ENCOUNTER — Encounter: Payer: Self-pay | Admitting: Physician Assistant

## 2024-04-27 ENCOUNTER — Ambulatory Visit

## 2024-04-27 ENCOUNTER — Ambulatory Visit: Attending: Physician Assistant | Admitting: Physician Assistant

## 2024-04-27 VITALS — BP 98/63 | HR 84 | Ht 64.0 in | Wt 244.4 lb

## 2024-04-27 DIAGNOSIS — R0602 Shortness of breath: Secondary | ICD-10-CM | POA: Diagnosis not present

## 2024-04-27 DIAGNOSIS — R55 Syncope and collapse: Secondary | ICD-10-CM

## 2024-04-27 DIAGNOSIS — R002 Palpitations: Secondary | ICD-10-CM

## 2024-04-27 NOTE — Progress Notes (Unsigned)
Enrolled patient for a 14 day Zio XT monitor to be mailed to patients home  Victoria Wilson to read

## 2024-04-27 NOTE — Patient Instructions (Addendum)
 Medication Instructions:  Your physician recommends that you continue on your current medications as directed. Please refer to the Current Medication list given to you today.  *If you need a refill on your cardiac medications before your next appointment, please call your pharmacy*  Lab Work: None ordered  If you have labs (blood work) drawn today and your tests are completely normal, you will receive your results only by: MyChart Message (if you have MyChart) OR A paper copy in the mail If you have any lab test that is abnormal or we need to change your treatment, we will call you to review the results.  Testing/Procedures: Your physician has requested that you have an echocardiogram. Echocardiography is a painless test that uses sound waves to create images of your heart. It provides your doctor with information about the size and shape of your heart and how well your heart's chambers and valves are working. This procedure takes approximately one hour. There are no restrictions for this procedure. Please do NOT wear cologne, perfume, aftershave, or lotions (deodorant is allowed). Please arrive 15 minutes prior to your appointment time.  Please note: We ask at that you not bring children with you during ultrasound (echo/ vascular) testing. Due to room size and safety concerns, children are not allowed in the ultrasound rooms during exams. Our front office staff cannot provide observation of children in our lobby area while testing is being conducted. An adult accompanying a patient to their appointment will only be allowed in the ultrasound room at the discretion of the ultrasound technician under special circumstances. We apologize for any inconvenience.   ZIO XT- Long Term Monitor Instructions  Your physician has requested you wear a ZIO patch monitor for 14 days.  This is a single patch monitor. [employer] supplies one patch monitor per enrollment. Additional stickers are not available.  Please do not apply patch if you will be having a Nuclear Stress Test,  Echocardiogram, Cardiac CT, MRI, or Chest Xray during the period you would be wearing the  monitor. The patch cannot be worn during these tests. You cannot remove and re-apply the  ZIO XT patch monitor.  Your ZIO patch monitor will be mailed 3 day USPS to your address on file. It may take 3-5 days  to receive your monitor after you have been enrolled.  Once you have received your monitor, please review the enclosed instructions. Your monitor  has already been registered assigning a specific monitor serial # to you.  Billing and Patient Assistance Program Information  We have supplied [employer] with any of your insurance information on file for billing purposes. [employer] offers a sliding scale Patient Assistance Program for patients that do not have  insurance, or whose insurance does not completely cover the cost of the ZIO monitor.  You must apply for the Patient Assistance Program to qualify for this discounted rate.  To apply, please call [employer] at [phone], select option 4, select option 2, ask to apply for  Patient Assistance Program. Meredeth will ask your household income, and how many people  are in your household. They will quote your out-of-pocket cost based on that information.  [employer] will also be able to set up a 34-month, interest-free payment plan if needed.  Applying the monitor   Shave hair from upper left chest.  Hold abrader disc by orange tab. Rub abrader in 40 strokes over the upper left chest as  indicated in your monitor instructions.  Clean area with 4 enclosed alcohol  pads. Let dry.  Apply patch as indicated in monitor instructions. Patch will be placed under collarbone on left  side of chest with arrow pointing upward.  Rub patch adhesive wings for 2 minutes. Remove white label marked 1. Remove the white  label marked 2. Rub patch adhesive wings for 2 additional minutes.  While  looking in a mirror, press and release button in center of patch. A small green light will  flash 3-4 times. This will be your only indicator that the monitor has been turned on.  Do not shower for the first 24 hours. You may shower after the first 24 hours.  Press the button if you feel a symptom. You will hear a small click. Record Date, Time and  Symptom in the Patient Logbook.  When you are ready to remove the patch, follow instructions on the last 2 pages of Patient  Logbook. Stick patch monitor onto the last page of Patient Logbook.  Place Patient Logbook in the blue and white box. Use locking tab on box and tape box closed  securely. The blue and white box has prepaid postage on it. Please place it in the mailbox as  soon as possible. Your physician should have your test results approximately 7 days after the  monitor has been mailed back to [facility].  Call [facility] Customer Care at [phone] if you have questions regarding  your ZIO XT patch monitor. Call them immediately if you see an orange light blinking on your  monitor.  If your monitor falls off in less than 4 days, contact our Monitor department at [phone].  If your monitor becomes loose or falls off after 4 days call [employer] at [phone] for  suggestions on securing your monitor   Follow-Up: At [facility], you and your health needs are our priority.  As part of our continuing mission to provide you with exceptional heart care, our providers are all part of one team.  This team includes your primary Cardiologist (physician) and Advanced Practice Providers or APPs (Physician Assistants and Nurse Practitioners) who all work together to provide you with the care you need, when you need it.  Your next appointment:   3 month(s)  Provider:   Glendia Ferrier, PA-C          We recommend signing up for the patient portal called MyChart.  Sign up information is provided on this After Visit Summary.   MyChart is used to connect with patients for Virtual Visits (Telemedicine).  Patients are able to view lab/test results, encounter notes, upcoming appointments, etc.  Non-urgent messages can be sent to your provider as well.   To learn more about what you can do with MyChart, go to [email or web address].   Other Instructions YOU NEED TO PUSH PUSH PUSH FLUIDS (WATER)\  USE MORE SALT  WEAR COMPRESSION STOCKINGS

## 2024-06-08 ENCOUNTER — Telehealth (HOSPITAL_COMMUNITY): Payer: Self-pay | Admitting: Physician Assistant

## 2024-06-08 ENCOUNTER — Ambulatory Visit (HOSPITAL_COMMUNITY): Attending: Cardiology

## 2024-06-08 NOTE — Telephone Encounter (Signed)
 Patient NO SHOWED echocardiogram on 06/08/24.  NO WE WILL NOT REACH OUT TO RESCHEDULE DUE TO NO SHOW RATE 23%. Order will be removed from the echo WQ and if patient calls back we will reinstate the order and schedule. Thank you.

## 2024-07-27 ENCOUNTER — Ambulatory Visit: Admitting: Physician Assistant

## 2024-07-30 ENCOUNTER — Ambulatory Visit: Admitting: Physician Assistant
